# Patient Record
Sex: Male | Born: 1970 | Race: White | Hispanic: Refuse to answer | Marital: Married | Smoking: Never smoker
Health system: Southern US, Community
[De-identification: ages and names within clinical notes are randomized; demographics above are authoritative.]

## PROBLEM LIST (undated history)

## (undated) DIAGNOSIS — Z0000 Encounter for general adult medical examination without abnormal findings: Secondary | ICD-10-CM

## (undated) DIAGNOSIS — G4730 Sleep apnea, unspecified: Secondary | ICD-10-CM

## (undated) DIAGNOSIS — M545 Low back pain, unspecified: Secondary | ICD-10-CM

## (undated) HISTORY — DX: Encounter for general adult medical examination without abnormal findings: Z00.00

## (undated) HISTORY — DX: Low back pain, unspecified: M54.50

## (undated) HISTORY — DX: Sleep apnea, unspecified: G47.30

## (undated) HISTORY — DX: Low back pain: M54.5

---

## 2001-03-04 ENCOUNTER — Ambulatory Visit (HOSPITAL_COMMUNITY): Admission: RE | Admit: 2001-03-04 | Discharge: 2001-03-04 | Payer: Self-pay | Admitting: Orthopedic Surgery

## 2001-03-04 ENCOUNTER — Encounter: Payer: Self-pay | Admitting: Orthopedic Surgery

## 2002-02-06 ENCOUNTER — Emergency Department (HOSPITAL_COMMUNITY): Admission: EM | Admit: 2002-02-06 | Discharge: 2002-02-06 | Payer: Self-pay | Admitting: Emergency Medicine

## 2002-02-06 ENCOUNTER — Encounter: Payer: Self-pay | Admitting: Emergency Medicine

## 2002-08-05 ENCOUNTER — Encounter: Payer: Self-pay | Admitting: General Practice

## 2002-08-05 ENCOUNTER — Encounter: Admission: RE | Admit: 2002-08-05 | Discharge: 2002-08-05 | Payer: Self-pay | Admitting: General Practice

## 2002-10-04 ENCOUNTER — Ambulatory Visit (HOSPITAL_COMMUNITY): Admission: RE | Admit: 2002-10-04 | Discharge: 2002-10-04 | Payer: Self-pay

## 2007-02-23 ENCOUNTER — Ambulatory Visit: Payer: Self-pay | Admitting: Pulmonary Disease

## 2007-02-23 LAB — CONVERTED CEMR LAB
ALT: 32 units/L (ref 0–40)
AST: 26 units/L (ref 0–37)
Albumin: 4 g/dL (ref 3.5–5.2)
Alkaline Phosphatase: 93 units/L (ref 39–117)
BUN: 11 mg/dL (ref 6–23)
Basophils Absolute: 0 10*3/uL (ref 0.0–0.1)
Basophils Relative: 0.3 % (ref 0.0–1.0)
Bilirubin Urine: NEGATIVE
Calcium: 9.2 mg/dL (ref 8.4–10.5)
Chloride: 107 meq/L (ref 96–112)
Creatinine, Ser: 1.2 mg/dL (ref 0.4–1.5)
HDL: 42.7 mg/dL (ref >39.0)
Hemoglobin, Urine: NEGATIVE
Ketones, ur: NEGATIVE mg/dL
LDL Cholesterol: 106 mg/dL — ABNORMAL HIGH (ref 0–99)
MCHC: 34.4 g/dL (ref 30.0–36.0)
Monocytes Relative: 6.7 % (ref 3.0–11.0)
Platelets: 296 10*3/uL (ref 150–400)
Potassium: 4.2 meq/L (ref 3.5–5.1)
RBC: 4.91 M/uL (ref 4.22–5.81)
RDW: 12.1 % (ref 11.5–14.6)
Specific Gravity, Urine: 1.015 (ref 1.000–1.03)
Total Bilirubin: 1.6 mg/dL — ABNORMAL HIGH (ref 0.3–1.2)
Total Protein, Urine: NEGATIVE mg/dL
Triglycerides: 62 mg/dL (ref 0–149)
VLDL: 12 mg/dL (ref 0–40)
pH: 7 (ref 5.0–8.0)

## 2007-06-08 ENCOUNTER — Ambulatory Visit: Payer: Self-pay | Admitting: Pulmonary Disease

## 2008-10-18 ENCOUNTER — Telehealth (INDEPENDENT_AMBULATORY_CARE_PROVIDER_SITE_OTHER): Payer: Self-pay | Admitting: *Deleted

## 2008-12-18 DIAGNOSIS — Z9889 Other specified postprocedural states: Secondary | ICD-10-CM | POA: Insufficient documentation

## 2008-12-19 ENCOUNTER — Ambulatory Visit: Payer: Self-pay | Admitting: Pulmonary Disease

## 2009-01-14 DIAGNOSIS — M545 Low back pain, unspecified: Secondary | ICD-10-CM | POA: Insufficient documentation

## 2009-04-27 ENCOUNTER — Telehealth (INDEPENDENT_AMBULATORY_CARE_PROVIDER_SITE_OTHER): Payer: Self-pay | Admitting: *Deleted

## 2009-06-12 ENCOUNTER — Telehealth: Payer: Self-pay | Admitting: Adult Health

## 2009-06-12 ENCOUNTER — Ambulatory Visit: Payer: Self-pay | Admitting: Internal Medicine

## 2009-06-13 LAB — CONVERTED CEMR LAB
Basophils Absolute: 0 10*3/uL (ref 0.0–0.1)
CO2: 29 meq/L (ref 19–32)
Calcium: 9.2 mg/dL (ref 8.4–10.5)
Creatinine, Ser: 1 mg/dL (ref 0.4–1.5)
Eosinophils Absolute: 0.4 10*3/uL (ref 0.0–0.7)
Glucose, Bld: 94 mg/dL (ref 70–99)
Hgb A1c MFr Bld: 5.4 % (ref 4.6–6.5)
Lymphocytes Relative: 26.7 % (ref 12.0–46.0)
MCHC: 34.5 g/dL (ref 30.0–36.0)
Neutro Abs: 4.1 10*3/uL (ref 1.4–7.7)
Neutrophils Relative %: 60.3 % (ref 43.0–77.0)
RDW: 12.3 % (ref 11.5–14.6)
Uric Acid, Serum: 5 mg/dL (ref 4.0–7.8)

## 2009-07-12 ENCOUNTER — Ambulatory Visit: Payer: Self-pay | Admitting: Adult Health

## 2009-07-12 DIAGNOSIS — M25519 Pain in unspecified shoulder: Secondary | ICD-10-CM | POA: Insufficient documentation

## 2009-10-24 ENCOUNTER — Telehealth: Payer: Self-pay | Admitting: Pulmonary Disease

## 2010-04-11 ENCOUNTER — Ambulatory Visit: Payer: Self-pay | Admitting: Pulmonary Disease

## 2010-04-22 ENCOUNTER — Ambulatory Visit: Payer: Self-pay | Admitting: Pulmonary Disease

## 2010-04-26 ENCOUNTER — Encounter: Payer: Self-pay | Admitting: Internal Medicine

## 2010-05-06 ENCOUNTER — Telehealth: Payer: Self-pay | Admitting: Pulmonary Disease

## 2010-05-17 ENCOUNTER — Ambulatory Visit: Payer: Self-pay | Admitting: Internal Medicine

## 2010-05-20 ENCOUNTER — Telehealth (INDEPENDENT_AMBULATORY_CARE_PROVIDER_SITE_OTHER): Payer: Self-pay | Admitting: *Deleted

## 2010-05-20 ENCOUNTER — Ambulatory Visit: Payer: Self-pay

## 2010-05-20 ENCOUNTER — Encounter: Payer: Self-pay | Admitting: Pulmonary Disease

## 2010-05-20 ENCOUNTER — Encounter: Payer: Self-pay | Admitting: Cardiovascular Disease

## 2010-05-22 ENCOUNTER — Encounter: Payer: Self-pay | Admitting: Internal Medicine

## 2010-05-24 ENCOUNTER — Encounter: Admission: RE | Admit: 2010-05-24 | Discharge: 2010-05-24 | Payer: Self-pay | Admitting: Internal Medicine

## 2010-05-24 ENCOUNTER — Inpatient Hospital Stay (HOSPITAL_COMMUNITY): Admission: EM | Admit: 2010-05-24 | Discharge: 2010-05-25 | Payer: Self-pay | Admitting: Emergency Medicine

## 2010-07-18 ENCOUNTER — Telehealth (INDEPENDENT_AMBULATORY_CARE_PROVIDER_SITE_OTHER): Payer: Self-pay | Admitting: *Deleted

## 2010-07-18 ENCOUNTER — Ambulatory Visit: Payer: Self-pay | Admitting: Pulmonary Disease

## 2010-07-30 ENCOUNTER — Telehealth (INDEPENDENT_AMBULATORY_CARE_PROVIDER_SITE_OTHER): Payer: Self-pay | Admitting: *Deleted

## 2010-07-31 ENCOUNTER — Ambulatory Visit: Payer: Self-pay | Admitting: Internal Medicine

## 2010-11-10 LAB — CONVERTED CEMR LAB
ALT: 24 units/L (ref 0–53)
AST: 23 units/L (ref 0–37)
Basophils Absolute: 0 10*3/uL (ref 0.0–0.1)
Basophils Relative: 0.6 % (ref 0.0–3.0)
Bilirubin, Direct: 0.2 mg/dL (ref 0.0–0.3)
CO2: 28 meq/L (ref 19–32)
Chloride: 105 meq/L (ref 96–112)
Cholesterol: 168 mg/dL (ref 0–200)
LDL Cholesterol: 108 mg/dL — ABNORMAL HIGH (ref 0–99)
Lymphocytes Relative: 29.5 % (ref 12.0–46.0)
MCHC: 35.1 g/dL (ref 30.0–36.0)
Neutrophils Relative %: 57.1 % (ref 43.0–77.0)
RBC: 4.89 M/uL (ref 4.22–5.81)
RDW: 11.9 % (ref 11.5–14.6)
Sodium: 141 meq/L (ref 135–145)
Total Bilirubin: 1.5 mg/dL — ABNORMAL HIGH (ref 0.3–1.2)
Total CHOL/HDL Ratio: 3.9
VLDL: 16 mg/dL (ref 0–40)

## 2010-11-12 NOTE — Progress Notes (Signed)
Summary: sinus infection----appt with TP today  Phone Note Call from Patient Call back at Home Phone [phone]   Caller: Patient Call For: parrett Summary of Call: Pt c/o fever, sinus infection x2 days, wants something called in.//[facility] [address]. Initial call taken by: Darletta Moll,  July 18, 2010 11:24 AM  Follow-up for Phone Call        called and spoke with pt.  pt c/o ? sinus infection, fever last night, and chest congestion but unable to cough up sputum.  Pt hasn't been seen since August 2011.  Will need ov.  Pt agreed to come in today and be seen by TP- needed latest appt possible.  Therefore pt scheduled at 4:30pm today with TP.  Aundra Millet Reynolds LPN  July 18, 2010 11:55 AM

## 2010-11-12 NOTE — Assessment & Plan Note (Signed)
Summary: Acute NP office visit - poss gout   CC:  poss gout in left foot and on the top at the joint x94month w/ some redness and swelling.  History of Present Illness: 40  y/o WM that is in usual good health w/ minimal health problems. Very active w/ exercise and sports. -wakeboarding.   July 12, 2009--Presents for an acute office visit. 1 week ago, wakeboarding, fell while doing a backflip, held on to rope when hit water felt arm/shoulder pull. Since then left shoulder upper arm, sore, painful to move, lift up. No radicular symptoms, tingling. Has not used meds. Previous episode w/ right should w/ MRI showing rotator cuff partial tear, no surgery. Took  ~1 yr to heal. Seen by [place] in 2007. Denies chest pain, dyspnea, orthopnea, hemoptysis, fever, n/v/d, edema, headache  April 11, 2010--Presents for an acute office visit. Complains of poss gout in left foot, on the top at the joint x39month w/ some redness and swelling. Severe pain initially, taking motrin w/ some help, swelling is better. No fever. He has hx of gout in past. Noticed symptoms 1 month ago after increasing protein supplements/shakes to 3 shakes a day.  Takes motrin 600mg  three times a day.  No rash, other joint swelling. Denies chest pain, dyspnea, orthopnea, hemoptysis, fever, n/v/d, edema, headache,recent travel.   Medications Prior to Update: 1)  Robaxin 500 Mg Tabs (Methocarbamol) .... Take 1 Tab By Mouth Three Times A Day As Needed For Muscle Spasm... 2)  Tramadol Hcl 50 Mg Tabs (Tramadol Hcl) .... Take 1 Tab By Mouth Three Times A Day As Needed For Pain.Marland KitchenMarland Kitchen 3)  Avelox 400 Mg Tabs (Moxifloxacin Hcl) .... Take 1 Tablet By Mouth Once A Day 4)  Medrol (Pak) 4 Mg Tabs (Methylprednisolone) .... As Directed  Current Medications (verified): 1)  Robaxin 500 Mg Tabs (Methocarbamol) .... Take 1 Tab By Mouth Three Times A Day As Needed For Muscle Spasm... 2)  Tramadol Hcl 50 Mg Tabs (Tramadol Hcl) .... Take 1 Tab By Mouth Three  Times A Day As Needed For Pain...  Allergies (verified): 1)  ! Pcn  Past History:  Past Medical History: Last updated: 07/12/2009 PHYSICAL EXAMINATION (ICD-V70.0) - good general health without new complaints or concerns...  ROTATOR CUFF partial tear in 2007, seen by Dr. Georg Ruddle PAIN, LUMBAR (ICD-724.2) - neg lumbar spine films in 2008...   Past Surgical History: Last updated: 12/19/2008 S/P rotator cuff surgery 2007  Family History: Last updated: 06/12/2009 Father, Garron Eline, alive age 51- smoker/ COPD, HBP, Chol Mother, Marcin Holte, alive age 55 - Chol, osteoporosis, colon polyps DM -PGM  Social History: Last updated: 12/19/2008 Married  1 child- daughter never smoked social alcohol  Risk Factors: Smoking Status: never (12/18/2008)  Review of Systems      See HPI  Vital Signs:  Patient profile:   40 year old male Height:      76 inches Weight:      218.31 pounds BMI:     26.67 O2 Sat:      98 % on Room air Temp:     96.8 degrees F oral Pulse rate:   71 / minute BP sitting:   120 / 70  (left arm) Cuff size:   large  Vitals Entered By: Boone Master CNA/MA (April 11, 2010 9:53 AM)  O2 Flow:  Room air CC: poss gout in left foot, on the top at the joint x30month w/ some redness and swelling Is Patient Diabetic? No  Comments Medications reviewed with patient Daytime contact number verified with patient. Boone Master CNA/MA  April 11, 2010 9:53 AM    Physical Exam  Additional Exam:  WD, WN, 40 y/o WM in NAD... GENERAL:  Alert & oriented; pleasant & cooperative. HEENT:  [place]/AT, E  NOSE-clear, THROAT-clear & wnl. NECK:  Supple w/ full ROM; no JVD; normal carotid impulses w/o bruits;   CHEST:  Clear to P & A; without wheezes/ rales/ or rhonchi. HEART:  Regular Rhythm; without murmurs/ rubs/ or gallops. ABDOMEN:  Soft & nontender; normal bowel sounds; no organomegaly or masses detected.  EXT: along base of 3/4th toe tender mild redness , nml rom.  mild puffiness at base. no varicose veins/ venous insuffic/ or edema.,  NEURO:  intact no focal deficits noted.  DERM:  No lesions noted; no rash etc...     Impression & Recommendations:  Problem # 1:  FOOT PAIN (ICD-729.5) ?etiolgoy possible gout flare.  will check labs w/ esr and uric acid.  we discussed tx options , will use steroid taper along w/ warm soaks for now.  pending labs decide on further tx .  would hold high dose protein shakes for now.   Medications Added to Medication List This Visit: 1)  Prednisone 10 Mg Tabs (Prednisone) .... 4 tabs for 2 days, then 3 tabs for 2 days, 2 tabs for 2 days, then 1 tab for 2 days, then stop  Complete Medication List: 1)  Robaxin 500 Mg Tabs (Methocarbamol) .... Take 1 tab by mouth three times a day as needed for muscle spasm... 2)  Tramadol Hcl 50 Mg Tabs (Tramadol hcl) .... Take 1 tab by mouth three times a day as needed for pain.Marland KitchenMarland Kitchen 3)  Prednisone 10 Mg Tabs (Prednisone) .... 4 tabs for 2 days, then 3 tabs for 2 days, 2 tabs for 2 days, then 1 tab for 2 days, then stop  Other Orders: TLB-Uric Acid, Blood (84550-URIC) TLB-Sedimentation Rate (ESR) (85652-ESR) Est. Patient Level IV (91478)  Patient Instructions: 1)  Rest elevate and warm soaks to foot 2)  Prednisone taper over next week.  3)  Gout prevention sheet 4)  I will call with labs.  5)  Please contact office for sooner follow up if symptoms do not improve or worsen  Prescriptions: PREDNISONE 10 MG TABS (PREDNISONE) 4 tabs for 2 days, then 3 tabs for 2 days, 2 tabs for 2 days, then 1 tab for 2 days, then stop  #20 x 0   Entered and Authorized by:   Rubye Oaks NP   Signed by:   Rubye Oaks NP on 04/11/2010   Method used:   Electronically to        [facility]* (retail)       [address] [place], [place]  [address]       Ph: [phone]       Fax: [phone]   RxID:   [id number]

## 2010-11-12 NOTE — Letter (Signed)
Summary: [facility]   Imported By: Sherian Rein 06/10/2010 09:59:07  _____________________________________________________________________  External Attachment:    Type:   Image     Comment:   External Document

## 2010-11-12 NOTE — Letter (Signed)
Summary: LT foot/[facility]  LT foot/[facility]   Imported By: Sherian Rein 05/28/2010 08:10:49  _____________________________________________________________________  External Attachment:    Type:   Image     Comment:   External Document

## 2010-11-12 NOTE — Progress Notes (Signed)
Summary: COUGH > needs ov---ov with MW for 07/31/2010  Phone Note Call from Patient   Caller: Patient Call For: PARRETT Summary of Call: PT WOULD LIKE SOMETHING FOR COUGH [facility] [address] Initial call taken by: Rickard Patience,  July 30, 2010 9:35 AM  Follow-up for Phone Call        called and spoke with pt.  pt was just recently seen by TP on 07/18/2010.  Pt states he has followed TP's recs from that visit including taking Clindamycin and Mucinex.  Pt states he hasn't noticed "much improvement in his Sx."  Pt c/o a "deep hacking cough" Pt states first thing in the morning he will cough up brown sputum but will then be non-productive throughout the rest of the day.  Pt also c/o tightness in chest and increased sob with activity.  Pt states when he 'breathes out it causes him to wheeze and it aggrevates his throat also causing him to cough."  Pt denied sore throat or fever.  Pt requests rx for cough.  Please advise.  Thanks.  Aundra Millet Reynolds LPN  July 30, 2010 9:57 AM  allergies: PCN, Ibu, Caffeine  see if we can work him in 10/20  with all meds in hand - nothing else to offer overy the phone Follow-up by: Nyoka Cowden MD,  July 30, 2010 1:26 PM  Additional Follow-up for Phone Call Additional follow up Details #1::        called and spoke with pt.  informed him of MW's recs.  Pt would like to be seen tomorrow.  Scheduled him to see MW tomorrow 07/31/2010 at 10:20am.  instructed pt to bring all meds with him to the visit.  pt verbalized understanding.  Aundra Millet Reynolds LPN  July 30, 2010 2:01 PM

## 2010-11-12 NOTE — Assessment & Plan Note (Signed)
Summary: NP follow up   CC:  foot no better and states the prednisone did help but 1.5wks ago pt "popped" the 3rd toe and the swelling and pain worsened.  also states back of left heel is painful and some edema in the left knee.  History of Present Illness: 40  y/o WM that is in usual good health w/ minimal health problems. Very active w/ exercise and sports. -wakeboarding.   July 12, 2009--Presents for an acute office visit. 1 week ago, wakeboarding, fell while doing a backflip, held on to rope when hit water felt arm/shoulder pull. Since then left shoulder upper arm, sore, painful to move, lift up. No radicular symptoms, tingling. Has not used meds. Previous episode w/ right should w/ MRI showing rotator cuff partial tear, no surgery. Took  ~1 yr to heal. Seen by [place] in 2007. Denies chest pain, dyspnea, orthopnea, hemoptysis, fever, n/v/d, edema, headache  April 11, 2010--Presents for an acute office visit. Complains of poss gout in left foot, on the top at the joint x42month w/ some redness and swelling. Severe pain initially, taking motrin w/ some help, swelling is better. No fever. He has hx of gout in past. Noticed symptoms 1 month ago after increasing protein supplements/shakes to 3 shakes a day.  Takes motrin 600mg  three times a day.  No rash, other joint swelling. Denies chest pain, dyspnea, orthopnea, hemoptysis, fever, n/v/d, edema, headache,recent travel.   April 22, 2010--Returns for persistent foot pain. Seen 2 weeks ago w/ left dorsal foot pain x 1 month. Uric acid was low. Minimal elevated ESR at . Given steroid taper. Returns w/ no improvement in symptoms.  wakeboards 1x week., exercising-weight liftings leg work 1-2 month. Area on top of foot along base of 2-4 digits is sore, swollen and tender. NO redness or fever. Denies chest pain, dyspnea, orthopnea, hemoptysis, fever, n/v/d.   Medications Prior to Update: 1)  Robaxin 500 Mg Tabs (Methocarbamol) .... Take 1 Tab By Mouth  Three Times A Day As Needed For Muscle Spasm... 2)  Tramadol Hcl 50 Mg Tabs (Tramadol Hcl) .... Take 1 Tab By Mouth Three Times A Day As Needed For Pain.Marland KitchenMarland Kitchen 3)  Prednisone 10 Mg Tabs (Prednisone) .... 4 Tabs For 2 Days, Then 3 Tabs For 2 Days, 2 Tabs For 2 Days, Then 1 Tab For 2 Days, Then Stop  Current Medications (verified): 1)  Robaxin 500 Mg Tabs (Methocarbamol) .... Take 1 Tab By Mouth Three Times A Day As Needed For Muscle Spasm... 2)  Tramadol Hcl 50 Mg Tabs (Tramadol Hcl) .... Take 1 Tab By Mouth Three Times A Day As Needed For Pain...  Allergies (verified): 1)  ! Pcn  Past History:  Past Medical History: Last updated: 07/12/2009 PHYSICAL EXAMINATION (ICD-V70.0) - good general health without new complaints or concerns...  ROTATOR CUFF partial tear in 2007, seen by Dr. Georg Ruddle PAIN, LUMBAR (ICD-724.2) - neg lumbar spine films in 2008...   Past Surgical History: Last updated: 12/19/2008 S/P rotator cuff surgery 2007  Family History: Last updated: 06/12/2009 Father, Keisuke Hollabaugh, alive age 93- smoker/ COPD, HBP, Chol Mother, Baine Decesare, alive age 80 - Chol, osteoporosis, colon polyps DM -PGM  Social History: Last updated: 12/19/2008 Married  1 child- daughter never smoked social alcohol  Risk Factors: Smoking Status: never (12/18/2008)  Review of Systems      See HPI  Vital Signs:  Patient profile:   40 year old male Height:      76 inches  Weight:      218 pounds BMI:     26.63 O2 Sat:      98 % on Room air Temp:     98.2 degrees F oral Pulse rate:   82 / minute BP sitting:   134 / 84  (left arm) Cuff size:   regular  Vitals Entered By: Boone Master CNA/MA (April 22, 2010 12:19 PM)  O2 Flow:  Room air CC: foot no better, states the prednisone did help but 1.5wks ago pt "popped" the 3rd toe and the swelling and pain worsened.  also states back of left heel is painful and some edema in the left knee Is Patient Diabetic? No Comments  Medications reviewed with patient Daytime contact number verified with patient. Boone Master CNA/MA  April 22, 2010 12:43 PM    Physical Exam  Additional Exam:  WD, WN, 40 y/o WM in NAD... GENERAL:  Alert & oriented; pleasant & cooperative. HEENT:  [place]/AT, E  NOSE-clear, THROAT-clear & wnl. NECK:  Supple w/ full ROM; no JVD; normal carotid impulses w/o bruits;   CHEST:  Clear to P & A; without wheezes/ rales/ or rhonchi. HEART:  Regular Rhythm; without murmurs/ rubs/ or gallops. ABDOMEN:  Soft & nontender; normal bowel sounds; no organomegaly or masses detected.  EXT: along base of 3/4th toe tender mild redness , nml rom. mild puffiness at base. no varicose veins/ venous insuffic/ or edema.,  NEURO:  intact no focal deficits noted.  DERM:  No lesions noted; no rash etc...     Impression & Recommendations:  Problem # 1:  FOOT PAIN (ICD-729.5) ? etiology of left foot pain /swelling  xray w/ no fx  REC:    We are setting you up with an orthopedist to evaluate foot pain.  Tramadol 50mg  as needed pain.  Rest elevate and ice to foot.   Please contact office for sooner follow up if symptoms do not improve or worsen  Orders: T-Foot Left Min 3 Views [id number]) Orthopedic Referral (Ortho) Est. Patient Level III (41324)  Complete Medication List: 1)  Robaxin 500 Mg Tabs (Methocarbamol) .... Take 1 tab by mouth three times a day as needed for muscle spasm... 2)  Tramadol Hcl 50 Mg Tabs (Tramadol hcl) .... Take 1 tab by mouth three times a day as needed for pain...  Patient Instructions: 1)  Set up for physical w/ Dr. Kriste Basque in march of next year.  2)  We are setting you up with an orthopedist to evaluate foot pain.  3)  Tramadol 50mg  as needed pain.  4)  Rest elevate and ice to foot.   5)  Please contact office for sooner follow up if symptoms do not improve or worsen

## 2010-11-12 NOTE — Miscellaneous (Signed)
Summary: Orders Update  Clinical Lists Changes  Orders: Added new Referral order of Rheumatology Referral (Rheumatology) - Signed 

## 2010-11-12 NOTE — Assessment & Plan Note (Signed)
Summary: cough/ congestion/ mbw   Primary Provider/Referring Provider:  Dr. Alroy Dust  CC:  achy all over---not feeling good--dry cough x 2 days--constant fever.  History of Present Illness: 40  y/o WM that is in usual good health w/ minimal health problems. Very active w/ exercise and sports. -wakeboarding.   July 12, 2009--Presents for an acute office visit. 1 week ago, wakeboarding, fell while doing a backflip, held on to rope when hit water felt arm/shoulder pull. Since then left shoulder upper arm, sore, painful to move, lift up. No radicular symptoms, tingling. Has not used meds. Previous episode w/ right should w/ MRI showing rotator cuff partial tear, no surgery. Took  ~1 yr to heal. Seen by [place] in 2007.  April 11, 2010--Presents for an acute office visit. Complains of poss gout in left foot, on the top at the joint x54month w/ some redness and swelling. Severe pain initially, taking motrin w/ some help, swelling is better. No fever. He has hx of gout in past. Noticed symptoms 1 month ago after increasing protein supplements/shakes to 3 shakes a day.  Takes motrin 600mg  three times a day.  No rash, other joint swelling.  April 22, 2010--Returns for persistent foot pain. Seen 2 weeks ago w/ left dorsal foot pain x 1 month. Uric acid was low. Minimal elevated ESR at . Given steroid taper. Returns w/ no improvement in symptoms.  wakeboards 1x week., exercising-weight liftings leg work 1-2 month. Area on top of foot along base of 2-4 digits is sore, swollen and tender. NO redness or fever. rec ortho eval > they rec rheum eval and rx with prednisone > all better, did not keep appt to See Dareen Piano, called in for more prednisone when symptoms flared 7/25 rx x 6 day taper  May 17, 2010 Acute visit. Pt c/o rt leg pain/swelling x 5 days- pt states that pain is worse when he stands and gets "instant swelling". no swelling below knee and the knee isn't stff, onset was actually 2 weeks ago  and noticed keys bumping area hurt, better after prednsione, worse off, with rash on Left lower leg also.  Left ankle fine now. >>refer to rheum. , venous dopp neg for dvt  July 18, 2010 --Presents for an acute office visit. Complains of 2 days of dry cough, fever tmax 101. , body aches, post nasal drainage.  Recently pt was admitted 8/12-8/13/11 for right thigh pain w/ fascitis  presumed to be inflammatory. Pt has had 3 months of lower extremities arthralgia prior to admit, tx w/ NSAIDS. Priro to admit with acute right thigh pain, sent for venous doppler which was neg for DVT. Sent to rheumatology for evaluation, MRI showed fascitis and focal myositis of the vastus lateralis muscle. w/ no descrete abscess. ESR was 19, CRP was 2.3, nml cmet, CK was 99. He was tx w/ steroids. NSAIDs were held due to possible reaction (petechiae rash along ankles). He was given empiric abx although this was felt not likely infectious. He completed a steroid pack and clindamycin.  Since discharge feels much better w/ no reoccurence of arthralgia pain. Back to baseline with activities. He is avoiding all NSAIDS. doing well until 2 days ago when he developed cough , fever and aches. Several co-workers have had similar symptoms. He has not used any meds for tx. Denies chest pain, dyspnea, orthopnea, hemoptysis,, n/v/d, edema, headache., neck stiffness, rash, joint swelling.   Preventive Screening-Counseling & Management  Alcohol-Tobacco     Smoking  Status: never  Medications Prior to Update: 1)  Robaxin 500 Mg Tabs (Methocarbamol) .... Take 1 Tab By Mouth Three Times A Day As Needed For Muscle Spasm... 2)  Tramadol Hcl 50 Mg Tabs (Tramadol Hcl) .... Take 1 Tab By Mouth Three Times A Day As Needed For Pain.Marland KitchenMarland Kitchen 3)  Prednisone 10 Mg  Tabs (Prednisone) .... 4 Each Am X 2days, 2x2days, 1x2days and Stop  Current Medications (verified): 1)  Robaxin 500 Mg Tabs (Methocarbamol) .... Take 1 Tab By Mouth Three Times A Day As Needed  For Muscle Spasm... 2)  Tramadol Hcl 50 Mg Tabs (Tramadol Hcl) .... Take 1 Tab By Mouth Three Times A Day As Needed For Pain...  Allergies: 1)  ! Pcn 2)  ! Ibuprofen 3)  ! Caffeine  Past History:  Past Surgical History: Last updated: 12/19/2008 S/P rotator cuff surgery 2007  Family History: Last updated: 06/12/2009 Father, Tysean Vandervliet, alive age 41- smoker/ COPD, HBP, Chol Mother, Wood Novacek, alive age 7 - Chol, osteoporosis, colon polyps DM -PGM  Social History: Last updated: 12/19/2008 Married  1 child- daughter never smoked social alcohol  Past Medical History: PHYSICAL EXAMINATION (ICD-V70.0) - good general health without new complaints or concerns...  ROTATOR CUFF partial tear in 2007, seen by Dr. Georg Ruddle PAIN, LUMBAR (ICD-724.2) - neg lumbar spine films in 2008  Left foot/ankle pain and R thigh pain better p prednisone per Lestine Box April 26, 2010     - Refer to rheumatology Dareen Piano) May 17, 2010       - Venous dopllers neg 05/20/10 >>Fascitis of right thigh, presumed inflammatory--MRI--- admitted 05/24/10. tx w/ steroids ?NSAID reaction  Review of Systems      See HPI  Vital Signs:  Patient profile:   40 year old male Height:      76 inches Weight:      235 pounds BMI:     28.71 O2 Sat:      98 % on Room air Temp:     101.1 degrees F oral Pulse rate:   100 / minute BP sitting:   118 / 76  (left arm) Cuff size:   large  Vitals Entered By: Randell Loop CMA (July 18, 2010 2:13 PM)  O2 Sat at Rest %:  100 O2 Flow:  Room air CC: achy all over---not feeling good--dry cough x 2 days--constant fever Is Patient Diabetic? No Pain Assessment Patient in pain? yes      Onset of pain  achy all over Comments meds and home number review with pt Randell Loop CMA  July 18, 2010 2:18 PM    Physical Exam  Additional Exam:   40 yo WM, NAD  GENERAL:  Alert & oriented; pleasant & cooperative. HEENT:  [place]/AT, E  NOSE-clear drainage,   THROAT-clear & wnl., neg nuchal rigidity NECK:  Supple w/ full ROM; no JVD; normal carotid impulses w/o bruits;   CHEST:  Clear to P & A; without wheezes/ rales/ or rhonchi. HEART:  Regular Rhythm; without murmurs/ rubs/ or gallops. ABDOMEN:  Soft & nontender; normal bowel sounds; no organomegaly or masses detected.  EXT : nml, no edema Skin: no rash, clear    Impression & Recommendations:  Problem # 1:  URI (ICD-465.9)  Acute URI vs influenze suspect is viral in nature.  recommend out of work  Plan:  Mucinex DM two times a day as needed cough/congestion Saline nasal rinses as needed  Claritin once daily as needed drainage.  Increase fluids and rest  Tylenol as needed aches and fever.  May take clindamycin as directed to have on hold if symptoms worsen /persist with discolored mucus.  Please contact office for sooner follow up if symptoms do not improve or worsen   Orders: Est. Patient Level III (16109)  Problem # 2:  LEG PAIN (ICD-729.5)  recent admission for myosiitis/facitis presumed inflammatory in nature. now improved  follow up rheumatology as scheduled and as needed   Orders: Est. Patient Level III (60454)  Complete Medication List: 1)  Robaxin 500 Mg Tabs (Methocarbamol) .... Take 1 tab by mouth three times a day as needed for muscle spasm... 2)  Tramadol Hcl 50 Mg Tabs (Tramadol hcl) .... Take 1 tab by mouth three times a day as needed for pain...  Patient Instructions: 1)  Mucinex DM two times a day as needed cough/congestion 2)  Saline nasal rinses as needed  3)  Claritin once daily as needed drainage.  4)  Increase fluids and rest  5)  Tylenol as needed aches and fever.  6)  May take clindamycin as directed to have on hold if symptoms worsen /persist with discolored mucus.  7)  Please contact office for sooner follow up if symptoms do not improve or worsen

## 2010-11-12 NOTE — Progress Notes (Signed)
Summary: fever-  Phone Note Call from Patient Call back at [phone]   Caller: Patient Call For: nadell Summary of Call: fever congestion  [facility]  Initial call taken by: Rickard Patience,  October 24, 2009 11:26 AM  Follow-up for Phone Call        called, spoke with pt.  Pt c/o chest congestion with brown and green mucus x2 1/2 wks.  states he woke up last night with a 103.0 fever, currently it's down to 100.0 after taking Goody Powder and BC.  denies chest tightness, wheezing, and SOB.  requesting med to be called in.  Will forward to SN-please advise. Thanks! Allergies-PCN [facility] Follow-up by: Gweneth Dimitri RN,  October 24, 2009 11:36 AM  Additional Follow-up for Phone Call Additional follow up Details #1::        pt called back again. Tivis Ringer  October 24, 2009 1:49 PM   per SN----ok to have avelox 400mg   #7  1 by mouth once daily  and medrol dose pak #1.  thanks Randell Loop CMA  October 24, 2009 2:26 PM     Additional Follow-up for Phone Call Additional follow up Details #2::    pt advised. rx sent. Carron Curie CMA  October 24, 2009 3:02 PM   New/Updated Medications: AVELOX 400 MG TABS (MOXIFLOXACIN HCL) Take 1 tablet by mouth once a day MEDROL (PAK) 4 MG TABS (METHYLPREDNISOLONE) as directed Prescriptions: MEDROL (PAK) 4 MG TABS (METHYLPREDNISOLONE) as directed  #1 pak x 0   Entered by:   Carron Curie CMA   Authorized by:   Michele Mcalpine MD   Signed by:   Carron Curie CMA on 10/24/2009   Method used:   Electronically to        [facility] # [phone]* (retail)       [address]       [place], [place]  [address]       Ph: [phone]       Fax: [phone]   RxID:   [id number] AVELOX 400 MG TABS (MOXIFLOXACIN HCL) Take 1 tablet by mouth once a day  #7 x 0   Entered by:   Carron Curie CMA   Authorized by:   Michele Mcalpine MD   Signed by:   Carron Curie CMA on 10/24/2009   Method used:    Electronically to        [facility] # [phone]* (retail)       [address]       [place], [place]  [address]       Ph: [phone]       Fax: [phone]   RxID:   [id number]

## 2010-11-12 NOTE — Miscellaneous (Signed)
Summary: Orders Update  Clinical Lists Changes  Orders: Added new Test order of Venous Duplex Lower Extremity (Venous Duplex Lower) - Signed 

## 2010-11-12 NOTE — Progress Notes (Signed)
Summary: swollen leg  Phone Note Call from Patient Call back at Home Phone [phone]   Caller: Patient Call For: wert Summary of Call: pt requests to speak to tp or nurse re: prednisone taper. pt says his r leg is "twice the sized of the left leg". believes this is because the dosage of prednisone is not enough. NOTE: pt saw me 8/5.  Initial call taken by: Tivis Ringer, CNA,  May 20, 2010 9:00 AM  Follow-up for Phone Call        Patient c/o increased sweeling in his right leg and was given Pred taper on Fri., 05/17/2010 by MW. The patient says his swelling is worse upon standing or walking and this Pred taper is not taken throughout the day. He will begin on 1x2 days tomorrow and would like to know what he needs to do until he can see rheumatology. Please advise.Michel Bickers CMA  May 20, 2010 9:31 AM  He missed his last rheum appt and should not be getting any more prednisone until seen - see if we can expedite the rheum w/u in any way and will need to return to see Tammy or Kriste Basque in meantime rather than be called in anything else from this office as we have no specific dx Follow-up by: Nyoka Cowden MD,  May 20, 2010 12:08 PM  Additional Follow-up for Phone Call Additional follow up Details #1::        Almyra Free or Bjorn Loser, can we see if there is any way pt can be seen sooner by rheumatology per MW.Lori Excell Seltzer [facility]  May 20, 2010 12:26 PM    Additional Follow-up for Phone Call Additional follow up Details #2::    pt has appt 05/22/10@9 :00am to see dr Vilma Meckel  med for rheu pt is aware of this appt  Follow-up by: Oneita Jolly,  May 20, 2010 12:33 PM   Appended Document: swollen leg Dr. Sherene Sires rec a stat venous doppler  Appended Document: swollen leg Dr. Sherene Sires has recommended he have a doppler of legs to evaluate persistent swelling we will call with ov appointment  Appended Document: swollen leg Spoke with pt and advised of the above.  Pt okay with this plan.  I  advised that if he had not heard from [facility] by arounf the end of the day to give [place] a call back.  Pt verbalized understanding.

## 2010-11-12 NOTE — Assessment & Plan Note (Signed)
Summary: Pulmonary/ ext ov for eval of uri/ cough   Primary Micha Dosanjh/Referring Markia Kyer:  Dr. Alroy Dust  CC:  Cough- worse .  History of Present Illness: 33  yowm never smoker  July 12, 2009--Presents for an acute office visit. 1 week ago, wakeboarding, fell while doing a backflip, held on to rope when hit water felt arm/shoulder pull. Since then left shoulder upper arm, sore, painful to move, lift up. No radicular symptoms, tingling. Has not used meds. Previous episode w/ right should w/ MRI showing rotator cuff partial tear, no surgery. Took  ~1 yr to heal. Seen by [place] in 2007.  April 11, 2010--Presents for an acute office visit. Complains of poss gout in left foot, on the top at the joint x33month w/ some redness and swelling. Severe pain initially, taking motrin w/ some help, swelling is better. No fever. He has hx of gout in past. Noticed symptoms 1 month ago after increasing protein supplements/shakes to 3 shakes a day.  Takes motrin 600mg  three times a day.  No rash, other joint swelling.  April 22, 2010--Returns for persistent foot pain. Seen 2 weeks ago w/ left dorsal foot pain x 1 month. Uric acid was low. Minimal elevated ESR at . Given steroid taper. Returns w/ no improvement in symptoms.  wakeboards 1x week., exercising-weight liftings leg work 1-2 month. Area on top of foot along base of 2-4 digits is sore, swollen and tender. NO redness or fever. rec ortho eval > they rec rheum eval and rx with prednisone > all better, did not keep appt to See Dareen Piano, called in for more prednisone when symptoms flared 7/25 rx x 6 day taper  May 17, 2010 Acute visit. Pt c/o rt leg pain/swelling x 5 days- pt states that pain is worse when he stands and gets "instant swelling". no swelling below knee and the knee isn't stff, onset was actually 2 weeks ago and noticed keys bumping area hurt, better after prednsione, worse off, with rash on Left lower leg also.  Left ankle fine now. >>refer  to rheum. , venous dopp neg for dvt July 18, 2010 --Presents for an acute office visit. Complains of 2 days of dry cough, fever tmax 101. , body aches, post nasal drainage.  Recently pt was admitted 8/12-8/13/11 for right thigh pain w/ fascitis  presumed to be inflammatory.   rec mucindex dm, clariton and clindamycin > rash on both legs one day later and stopped and rash began resolving.  July 31, 2010 cc "no better" now with congested cough, sob , mucus mostly white, subjective wheeze with FVC maneuver but no noct wheeze or am exac.  Pt denies any significant sore throat, dysphagia, itching, sneezing,  nasal congestion or excess secretions,  fever, chills, sweats, unintended wt loss, pleuritic or exertional cp, hempoptysis,orthopnea pnd or leg swelling.  overt heartburn  Current Medications (verified): 1)  Robaxin 500 Mg Tabs (Methocarbamol) .... Take 1 Tab By Mouth Three Times A Day As Needed For Muscle Spasm... 2)  Tramadol Hcl 50 Mg Tabs (Tramadol Hcl) .... Take 1 Tab By Mouth Three Times A Day As Needed For Pain.Marland KitchenMarland Kitchen 3)  Mucinex Dm 30-600 Mg Xr12h-Tab (Dextromethorphan-Guaifenesin) .Marland Kitchen.. 1 Two Times A Day As Needed 4)  Claritin 10 Mg Tabs (Loratadine) .Marland Kitchen.. 1 Once Daily As Needed  Allergies (verified): 1)  ! Pcn 2)  ! Ibuprofen 3)  ! Caffeine  Past History:  Past Medical History: PHYSICAL EXAMINATION (ICD-V70.0) - good general health without new complaints  or concerns...  ROTATOR CUFF partial tear in 2007, seen by Dr. Georg Ruddle PAIN, LUMBAR (ICD-724.2) - neg lumbar spine films in 2008  Left foot/ankle pain and R thigh pain better p prednisone per Lestine Box April 26, 2010     - Refer to rheumatology Dareen Piano) May 17, 2010  >>  last rx pred 05/27/10      - Venous dopllers neg 05/20/10 >>Fascitis of right thigh, presumed inflammatory--MRI--- admitted 05/24/10. tx w/ steroids  ?NSAID reaction  Vital Signs:  Patient profile:   40 year old male Weight:      229.38 pounds O2 Sat:       95 % on Room air Temp:     98.1 degrees F oral Pulse rate:   78 / minute BP sitting:   110 / 78  (left arm) Cuff size:   large  Vitals Entered By: Vernie Murders (July 31, 2010 10:28 AM)  O2 Flow:  Room air  Physical Exam  Additional Exam:  anxious amb wm nad wt  235 > 229 July 31, 2010  GENERAL:  Alert & oriented; pleasant & cooperative. HEENT:  [place]/AT, E  NOSE-clear drainage,  THROAT-clear & wnl., neg nuchal rigidity NECK:  Supple w/ full ROM; no JVD; normal carotid impulses w/o bruits;   CHEST:  exp bilateral rhonchi resolved with puse lip HEART:  Regular Rhythm; without murmurs/ rubs/ or gallops. ABDOMEN:  Soft & nontender; normal bowel sounds; no organomegaly or masses detected.  EXT : nml, no edema Skin: bilateral stocking pattern punctate erythematous macular rash   Impression & Recommendations:  Problem # 1:  URI (ICD-465.9)  His updated medication list for this problem includes:    Mucinex Dm 30-600 Mg Xr12h-tab (Dextromethorphan-guaifenesin) ..... Up to 1200 mg  two times a day as needed    Claritin 10 Mg Tabs (Loratadine) .Marland Kitchen... 1 once daily as needed  Explained natural h/o uri and why it's necessary in patients at risk to rx short term with PPI to reduce risk of evolving cyclical cough triggered by epithelial injury and a heightened sensitivty to the effects of any upper airway irritants,  most importantly acid - related   Hopefully will not need inhalers See instructions for specific recommendations   Problem # 2:  ARTHRALGIA UNSPECIFIED SITE (ICD-719.40)  Very unusual patter of aches / pain / rash may have underlying vasculitis > f/u rheum as needed  Each maintenance medication was reviewed in detail including most importantly the difference between maintenance prns and under what circumstances the prns are to be used.  In addition, these two groups (for which the patient should keep up with refills) were distinguished from a third group :  meds that are  used only short term with the intent to complete a course of therapy and then not refill them.  The med list was then fully reconciled and reorganized to reflect this important distinction.  See instructions for specific recommendations   Medications Added to Medication List This Visit: 1)  Mucinex Dm 30-600 Mg Xr12h-tab (Dextromethorphan-guaifenesin) .Marland Kitchen.. 1 two times a day as needed 2)  Mucinex Dm 30-600 Mg Xr12h-tab (Dextromethorphan-guaifenesin) .... Up to 1200 mg  two times a day as needed 3)  Claritin 10 Mg Tabs (Loratadine) .Marland Kitchen.. 1 once daily as needed 4)  Prednisone 10 Mg Tabs (Prednisone) .... 4 each am x 2days, 2x2days, 1x2days and stop 5)  Pepcid 20 Mg Tabs (Famotidine) .... One twice daily until not coughing at all  Other Orders: Est.  Patient Level IV (78295)  Patient Instructions: 1)  Prednisone 10 mg 4 each am x 2days, 2x2days, 1x2days and stop  2)  Pepcid otc 20 mg one after  breakfast and one at bedtime for two weeks 3)  if coughing use mucinex dm 1200 mg twice daily and if still coughing supplement with tramadol up to 100 mg every 4 hours 4)  GERD (REFLUX)  is a common cause of respiratory symptoms. It commonly presents without heartburn and can be treated with medication, but also with lifestyle changes including avoidance of late meals, excessive alcohol, smoking cessation, and avoid fatty foods, chocolate, peppermint, colas, red wine, and acidic juices such as orange juice. NO MINT OR MENTHOL PRODUCTS SO NO COUGH DROPS  5)  USE SUGARLESS CANDY INSTEAD (jolley ranchers)  6)  NO OIL BASED VITAMINS  7)  When you come to the Doctor's office always bring every active medication with you, even stuff you take over the counter  8)  if not improving we need to see you again in a week Prescriptions: PREDNISONE 10 MG  TABS (PREDNISONE) 4 each am x 2days, 2x2days, 1x2days and stop  #14 x 0   Entered and Authorized by:   Nyoka Cowden MD   Signed by:   Nyoka Cowden MD on 07/31/2010    Method used:   Electronically to        [facility] [address] [phone]* (retail)       [address]       [place], [place]  [address]       Ph: [phone] or [phone]       Fax: [phone]   RxID:   [id number]

## 2010-11-12 NOTE — Progress Notes (Signed)
Summary: rx pred pulse dose  Phone Note Call from Patient Call back at [phone]   Caller: Patient Call For: Tressie Ragin [place] to pharmacy for swelling in knee and foot pharmacy [facility] [phone] Initial call taken by: Rickard Patience,  May 06, 2010 9:13 AM  Follow-up for Phone Call        called and spoke with pt- he stated that he saw TP for his foot swelling--was sent to ortho and ortho sent him to Rhematology, which he has an appt next week with them--pt is requesting a 7 day taper of prednisone---he stated that this has helped him alot in the past---pt is afraid to take the ibuprofen as this causes him to break out in a rash.  [place].  please advise. SN and TP out of the office today. Randell Loop CMA  May 06, 2010 9:27 AM  ok, give same rx Tammy did Follow-up by: Nyoka Cowden MD,  May 06, 2010 10:14 AM  Additional Follow-up for Phone Call Additional follow up Details #1::        called in rx for pred taper to pharmacy for pt---ok per MW.  called and spoke with pt and he is aware of rx called to the pharmacy.   Randell Loop CMA  May 06, 2010 10:31 AM

## 2010-11-12 NOTE — Assessment & Plan Note (Signed)
Summary: Primary svc/ flare of leg symptoms off prednisone    Primary Provider/Referring Provider:  Dr. Alroy Dust  CC:  Acute visit. Pt c/o rt leg pain/swelling x 5 days- pt states that pain is worse when he stands and gets "instant swelling".  He denies any known injury to his leg. Marland Kitchen  History of Present Illness: 40  y/o WM that is in usual good health w/ minimal health problems. Very active w/ exercise and sports. -wakeboarding.   July 12, 2009--Presents for an acute office visit. 1 week ago, wakeboarding, fell while doing a backflip, held on to rope when hit water felt arm/shoulder pull. Since then left shoulder upper arm, sore, painful to move, lift up. No radicular symptoms, tingling. Has not used meds. Previous episode w/ right should w/ MRI showing rotator cuff partial tear, no surgery. Took  ~1 yr to heal. Seen by [place] in 2007.  April 11, 2010--Presents for an acute office visit. Complains of poss gout in left foot, on the top at the joint x62month w/ some redness and swelling. Severe pain initially, taking motrin w/ some help, swelling is better. No fever. He has hx of gout in past. Noticed symptoms 1 month ago after increasing protein supplements/shakes to 3 shakes a day.  Takes motrin 600mg  three times a day.  No rash, other joint swelling.  April 22, 2010--Returns for persistent foot pain. Seen 2 weeks ago w/ left dorsal foot pain x 1 month. Uric acid was low. Minimal elevated ESR at . Given steroid taper. Returns w/ no improvement in symptoms.  wakeboards 1x week., exercising-weight liftings leg work 1-2 month. Area on top of foot along base of 2-4 digits is sore, swollen and tender. NO redness or fever. rec ortho eval > they rec rheum eval and rx with prednisone > all better, did not keep appt to See Dareen Piano, called in for more prednisone when symptoms flared 7/25 rx x 6 day taper  May 17, 2010 Acute visit. Pt c/o rt leg pain/swelling x 5 days- pt states that pain is worse  when he stands and gets "instant swelling". no swelling below knee and the knee isn't stff, onset was actually 2 weeks ago and noticed keys bumping area hurt, better after prednsione, worse off, with rash on Left lower leg also.  Left ankle fine now.  felt feverish after bath, no chills, no tick bites or unusual exposures.  Current Medications (verified): 1)  Robaxin 500 Mg Tabs (Methocarbamol) .... Take 1 Tab By Mouth Three Times A Day As Needed For Muscle Spasm... 2)  Tramadol Hcl 50 Mg Tabs (Tramadol Hcl) .... Take 1 Tab By Mouth Three Times A Day As Needed For Pain...  Allergies (verified): 1)  ! Pcn  Past History:  Past Medical History: PHYSICAL EXAMINATION (ICD-V70.0) - good general health without new complaints or concerns...  ROTATOR CUFF partial tear in 2007, seen by Dr. Georg Ruddle PAIN, LUMBAR (ICD-724.2) - neg lumbar spine films in 2008  Left foot/ankle pain and R thigh pain better p prednisone per Lestine Box April 26, 2010     - Refer to rheumatology Dareen Piano) May 17, 2010       - Venous dopllers neg 05/20/10  Vital Signs:  Patient profile:   40 year old male Weight:      219 pounds O2 Sat:      96 % on Room air Temp:     98.7 degrees F oral Pulse rate:   83 / minute  BP sitting:   140 / 88  (left arm)  Vitals Entered By: Vernie Murders (May 17, 2010 9:32 AM)  O2 Flow:  Room air  Physical Exam  Additional Exam:  Very anxious wm failed to answer a single question asked in a straightforward manner, tending to go off on tangents or answer questions with ambiguous medical terms or diagnoses and seemed perplexed and even more anxious when asked the same question more than once for clarification.  GENERAL:  Alert & oriented; pleasant & cooperative. HEENT:  [place]/AT, E  NOSE-clear, THROAT-clear & wnl. NECK:  Supple w/ full ROM; no JVD; normal carotid impulses w/o bruits;   CHEST:  Clear to P & A; without wheezes/ rales/ or rhonchi. HEART:  Regular Rhythm; without  murmurs/ rubs/ or gallops. ABDOMEN:  Soft & nontender; normal bowel sounds; no organomegaly or masses detected.  EXT:  superficial sts right thigh above level where he had placed tight ace wrap, slt erythema, slt woody induration about palm size - right knee and left foot nl, no calf tenderness, neg homans.  also punctate erythemaous rash L calf    Impression & Recommendations:  Problem # 1:  LEG PAIN (ICD-729.5)  Very unusual findings with response to prednisone pointing to an inflammatory process but no evidence at all of infection or clotting disorder.  Def needs to see rheum before next course of prednisone compllete.   Orders: Est. Patient Level IV (74259)  Medications Added to Medication List This Visit: 1)  Prednisone 10 Mg Tabs (Prednisone) .... 4 each am x 2days, 2x2days, 1x2days and stop  Patient Instructions: 1)  Prednisone 10 mg 4 each am x 2days, 2x2days, 1x2days and stop 2)  stop heat, stop ace wraps 3)  See Patient Care Coordinator before leaving for re-rheumatology asap and also sign for release of ortho records/ labs Prescriptions: PREDNISONE 10 MG  TABS (PREDNISONE) 4 each am x 2days, 2x2days, 1x2days and stop  #14 x 0   Entered and Authorized by:   Nyoka Cowden MD   Signed by:   Nyoka Cowden MD on 05/17/2010   Method used:   Electronically to        [facility]* (retail)       [address] [place], [place]  [address]       Ph: [phone]       Fax: [phone]   RxID:   [id number]

## 2010-12-27 LAB — CULTURE, BLOOD (ROUTINE X 2): Culture: NO GROWTH

## 2010-12-27 LAB — BASIC METABOLIC PANEL
CO2: 27 mEq/L (ref 19–32)
Calcium: 9 mg/dL (ref 8.4–10.5)
Glucose, Bld: 111 mg/dL — ABNORMAL HIGH (ref 70–99)
Potassium: 3.9 mEq/L (ref 3.5–5.1)
Sodium: 140 mEq/L (ref 135–145)

## 2010-12-27 LAB — COMPREHENSIVE METABOLIC PANEL
AST: 20 U/L (ref 0–37)
Albumin: 3.7 g/dL (ref 3.5–5.2)
CO2: 29 mEq/L (ref 19–32)
Calcium: 8.9 mg/dL (ref 8.4–10.5)
Creatinine, Ser: 1.15 mg/dL (ref 0.4–1.5)
GFR calc Af Amer: >60 mL/min (ref >60)
GFR calc non Af Amer: >60 mL/min (ref >60)
Total Protein: 6.7 g/dL (ref 6.0–8.3)

## 2010-12-27 LAB — SEDIMENTATION RATE: Sed Rate: 19 mm/hr — ABNORMAL HIGH (ref 0–16)

## 2010-12-27 LAB — CBC
HCT: 40.7 % (ref 39.0–52.0)
HCT: 41.7 % (ref 39.0–52.0)
Hemoglobin: 14.1 g/dL (ref 13.0–17.0)
MCH: 31.1 pg (ref 26.0–34.0)
MCH: 31.5 pg (ref 26.0–34.0)
MCHC: 34.6 g/dL (ref 30.0–36.0)
MCV: 90.7 fL (ref 78.0–100.0)
MCV: 90.8 fL (ref 78.0–100.0)
Platelets: 226 10*3/uL (ref 150–400)
RBC: 4.6 MIL/uL (ref 4.22–5.81)

## 2010-12-27 LAB — DIFFERENTIAL
Eosinophils Relative: 4 % (ref 0–5)
Lymphocytes Relative: 29 % (ref 12–46)
Lymphs Abs: 2.1 10*3/uL (ref 0.7–4.0)

## 2011-06-05 ENCOUNTER — Other Ambulatory Visit (INDEPENDENT_AMBULATORY_CARE_PROVIDER_SITE_OTHER): Payer: Managed Care, Other (non HMO)

## 2011-06-05 ENCOUNTER — Ambulatory Visit (INDEPENDENT_AMBULATORY_CARE_PROVIDER_SITE_OTHER): Payer: Managed Care, Other (non HMO) | Admitting: Pulmonary Disease

## 2011-06-05 ENCOUNTER — Ambulatory Visit (INDEPENDENT_AMBULATORY_CARE_PROVIDER_SITE_OTHER)
Admission: RE | Admit: 2011-06-05 | Discharge: 2011-06-05 | Disposition: A | Discharge status: Home / Self Care | Payer: Managed Care, Other (non HMO) | Source: Ambulatory Visit | Attending: Pulmonary Disease | Admitting: Pulmonary Disease

## 2011-06-05 ENCOUNTER — Encounter: Payer: Self-pay | Admitting: Pulmonary Disease

## 2011-06-05 VITALS — BP 120/78 | HR 63 | Temp 97.0°F | Ht 76.0 in | Wt 240.2 lb

## 2011-06-05 DIAGNOSIS — Z0000 Encounter for general adult medical examination without abnormal findings: Secondary | ICD-10-CM

## 2011-06-05 LAB — BASIC METABOLIC PANEL
Chloride: 107 mEq/L (ref 96–112)
Potassium: 4.2 mEq/L (ref 3.5–5.1)

## 2011-06-05 LAB — URINALYSIS
Bilirubin Urine: NEGATIVE
Ketones, ur: NEGATIVE
Nitrite: NEGATIVE
Total Protein, Urine: NEGATIVE

## 2011-06-05 LAB — CBC WITH DIFFERENTIAL/PLATELET
Basophils Relative: 0.4 % (ref 0.0–3.0)
Eosinophils Absolute: 0.2 10*3/uL (ref 0.0–0.7)
HCT: 46.4 % (ref 39.0–52.0)
Lymphs Abs: 1.6 10*3/uL (ref 0.7–4.0)
MCHC: 34.2 g/dL (ref 30.0–36.0)
MCV: 92.4 fl (ref 78.0–100.0)
Monocytes Absolute: 0.5 10*3/uL (ref 0.1–1.0)
Neutrophils Relative %: 58.5 % (ref 43.0–77.0)
Platelets: 240 10*3/uL (ref 150.0–400.0)
RBC: 5.02 Mil/uL (ref 4.22–5.81)

## 2011-06-05 LAB — LIPID PANEL
LDL Cholesterol: 107 mg/dL — ABNORMAL HIGH (ref 0–99)
Total CHOL/HDL Ratio: 3

## 2011-06-05 LAB — HEPATIC FUNCTION PANEL
ALT: 20 U/L (ref 0–53)
AST: 20 U/L (ref 0–37)
Bilirubin, Direct: 0.2 mg/dL (ref 0.0–0.3)
Total Bilirubin: 1.6 mg/dL — ABNORMAL HIGH (ref 0.3–1.2)

## 2011-06-05 NOTE — Patient Instructions (Signed)
Today we did your follow up CXR & fasting blood work...    Please call the [facility]REE in a few days for your results...    Dial [id number] & when prompted enter your patient number followed by the # symbol...    Your patient number is:  [id number]  Call for any questions...  Keep up the good work w/ diet & exercise, try to keep your weight down.Marland KitchenMarland Kitchen

## 2011-06-05 NOTE — Progress Notes (Signed)
Subjective:    Patient ID: Thomas Koch, male    DOB: 1971/04/01, 40 y.o.   MRN: [id number]  HPI 40 y/o WM here for a follow up visit and CPX... he is the son of Thomas & Thomas Koch, he is essentially a non-smoker, and enjoys excellent general medical health... his only complaint is LBP intermittently- likely mechanical/ muscle related...  ~  June 05, 2011:  2.5 yr ROV & CPX> he reports feeling well & has no new complaints or concerns; on no regular meds now but relates an interesting hx over the last year w/ unknown/ undiagnosed rheumatologic syndrome; presented 8/11 w/ right leg swelling and pain in his leg, knee, foot, & soft tissues; eval by Rheum Thomas Koch ?etiology ?inflamm, ?infectious; Hosp by Thomas Koch w/ right thigh pain> ?fasciitis, polyarthritis, raynauds; treated w/ Clinda, Pred, Benedryl;  Etiology remains obscure but the pt wonders about coffee & meds he was taking "I was very sensitive to caffeine" & all symptoms finally resolved off all meds & off all caffeine...  Now he works out regularly in gym, runs, etc> feeling well w/o residual effects...    CPX today w/ norm vital signs, neg exam, & normal CXR/ EKG/ Labs...   Current Problems:   PHYSICAL EXAMINATION (ICD-V70.0) - good general health without new complaints or concerns...  Hx of right thigh pain/ swelling/ inflamm ?etiology 8/11> ?fasciitis treated w/ Antibiotics, Pred, Benedryl, etc but pt feeling this may have been caused by caffeine & finally resolved w/o residual effects off all meds & off all caffeine...  ROTATOR CUFF REPAIR, HX OF (ICD-V45.89)  Hx of BACK PAIN, LUMBAR (ICD-724.2) - neg lumbar spine films in 2008...   Past Medical History  Diagnosis Date  . Physical exam, annual   . Lumbar back pain     Past Surgical History  Procedure Date  . Rotator cuff surgery 2007    Dr. Simonne Koch    Outpatient Encounter Prescriptions as of 06/05/2011   >>   OFF ALL MEDS NOW  Medication Sig Dispense Refill  .  dextromethorphan-guaiFENesin (MUCINEX DM) 30-600 MG per 12 hr tablet Take 2 tablets by mouth every 12 (twelve) hours.        . famotidine (PEPCID) 20 MG tablet Take 20 mg by mouth 2 (two) times daily.        Marland Kitchen loratadine (CLARITIN) 10 MG tablet Take 10 mg by mouth daily.        . methocarbamol (ROBAXIN) 500 MG tablet Take 500 mg by mouth 3 (three) times daily.        . traMADol (ULTRAM) 50 MG tablet Take 50 mg by mouth every 6 (six) hours as needed.          Allergies  Allergen Reactions  . Caffeine     REACTION: causes hives, itching  . Ibuprofen     REACTION: hives  . Penicillins     REACTION: rash    Current Medications, Allergies, Past Medical History, Past Surgical History, Family History, and Social History were reviewed in [employer] record.   Review of Systems    The patient denies fever, chills, sweats, anorexia, fatigue, weakness, malaise, weight loss, sleep disorder, blurring, diplopia, eye irritation, eye discharge, vision loss, eye pain, photophobia, earache, ear discharge, tinnitus, decreased hearing, nasal congestion, nosebleeds, sore throat, hoarseness, chest pain, palpitations, syncope, dyspnea on exertion, orthopnea, PND, peripheral edema, cough, dyspnea at rest, excessive sputum, hemoptysis, wheezing, pleurisy, nausea, vomiting, diarrhea, constipation, change in bowel habits, abdominal pain, melena,  hematochezia, jaundice, gas/bloating, indigestion/heartburn, dysphagia, odynophagia, dysuria, hematuria, urinary frequency, urinary hesitancy, nocturia, incontinence, back pain, joint pain, joint swelling, muscle cramps, muscle weakness, stiffness, arthritis, sciatica, restless legs, leg pain at night, leg pain with exertion, rash, itching, dryness, suspicious lesions, paralysis, paresthesias, seizures, tremors, vertigo, transient blindness, frequent falls, frequent headaches, difficulty walking, depression, anxiety, memory loss, confusion, cold  intolerance, heat intolerance, polydipsia, polyphagia, polyuria, unusual weight change, abnormal bruising, bleeding, enlarged lymph nodes, urticaria, allergic rash, hay fever, and recurrent infections.    Objective:   Physical Exam     WD, WN, 40 y/o WM in NAD... GENERAL:  Alert & oriented; pleasant & cooperative. HEENT:  [place]/AT, EOM-wnl, PERRLA, Fundi-benign, EACs-clear, TMs-wnl, NOSE-clear, THROAT-clear & wnl. NECK:  Supple w/ full ROM; no JVD; normal carotid impulses w/o bruits; no thyromegaly or nodules palpated; no lymphadenopathy. CHEST:  Clear to P & A; without wheezes/ rales/ or rhonchi. HEART:  Regular Rhythm; without murmurs/ rubs/ or gallops. ABDOMEN:  Soft & nontender; normal bowel sounds; no organomegaly or masses detected. RECTAL:  Neg - prostate 2+ & nontender w/o nodules; stool hematest neg. EXT: without deformities or arthritic changes; no varicose veins/ venous insuffic/ or edema. NEURO:  CN's intact; motor testing normal; sensory testing normal; gait normal & balance OK. DERM:  No lesions noted; no rash etc...  CXR:  Clear & WNL.Marland KitchenMarland Kitchen  EKG:  NSR, WNL.Marland KitchenMarland Kitchen  Fasting LABS:  FLP, CBC, Chems, TSH, UA> all wnl...   Assessment & Plan:   CPX>  Good general health...  Hx right thigh pain/ swelling 8/11>  ?etiology but all resolved now & pt thinks related to caffeine sensitivity, now off all caffeine & doing satis w/o residual effects...  Hx rotator cuff repair.Marland KitchenMarland Kitchen

## 2011-06-10 ENCOUNTER — Encounter: Payer: Self-pay | Admitting: Pulmonary Disease

## 2012-08-13 HISTORY — PX: OTHER SURGICAL HISTORY: SHX169

## 2012-11-20 ENCOUNTER — Other Ambulatory Visit: Payer: Self-pay | Admitting: Pulmonary Disease

## 2013-02-14 ENCOUNTER — Telehealth: Payer: Self-pay | Admitting: Pulmonary Disease

## 2013-02-14 DIAGNOSIS — Z0000 Encounter for general adult medical examination without abnormal findings: Secondary | ICD-10-CM

## 2013-02-14 NOTE — Telephone Encounter (Signed)
I spoke with pt and he just wanted to make he has a CXR the day of his CPX on 03/02/13 since it has been a while since he has done one. He is not requesting to have labs done prior to his appt. This is FYI for Dr. Kriste Basque thanks

## 2013-02-15 NOTE — Telephone Encounter (Signed)
Spoke with patient made him aware of this. Verbalized understanding and nothing further needed at this time.

## 2013-02-15 NOTE — Telephone Encounter (Signed)
lmomtcb x1 for pt 

## 2013-02-15 NOTE — Telephone Encounter (Signed)
Labs have been placed in the computer for the pt and he can have the cxr done the day of his appt.  thanks

## 2013-03-02 ENCOUNTER — Ambulatory Visit (INDEPENDENT_AMBULATORY_CARE_PROVIDER_SITE_OTHER): Payer: Managed Care, Other (non HMO) | Admitting: Pulmonary Disease

## 2013-03-02 ENCOUNTER — Encounter: Payer: Self-pay | Admitting: Pulmonary Disease

## 2013-03-02 ENCOUNTER — Other Ambulatory Visit (INDEPENDENT_AMBULATORY_CARE_PROVIDER_SITE_OTHER): Payer: Managed Care, Other (non HMO)

## 2013-03-02 VITALS — BP 110/84 | HR 70 | Temp 97.2°F | Ht 76.0 in | Wt 241.0 lb

## 2013-03-02 DIAGNOSIS — E663 Overweight: Secondary | ICD-10-CM

## 2013-03-02 DIAGNOSIS — Z0000 Encounter for general adult medical examination without abnormal findings: Secondary | ICD-10-CM

## 2013-03-02 DIAGNOSIS — C439 Malignant melanoma of skin, unspecified: Secondary | ICD-10-CM

## 2013-03-02 LAB — URINALYSIS, ROUTINE W REFLEX MICROSCOPIC
Nitrite: NEGATIVE
Total Protein, Urine: NEGATIVE
Urine Glucose: NEGATIVE
pH: 6 (ref 5.0–8.0)

## 2013-03-02 LAB — CBC WITH DIFFERENTIAL/PLATELET
Basophils Relative: 0.3 % (ref 0.0–3.0)
Eosinophils Absolute: 0.2 10*3/uL (ref 0.0–0.7)
Eosinophils Relative: 3.1 % (ref 0.0–5.0)
Hemoglobin: 16 g/dL (ref 13.0–17.0)
MCHC: 34.1 g/dL (ref 30.0–36.0)
MCV: 91.6 fl (ref 78.0–100.0)
Monocytes Absolute: 0.6 10*3/uL (ref 0.1–1.0)
Neutro Abs: 4.9 10*3/uL (ref 1.4–7.7)
Neutrophils Relative %: 62.9 % (ref 43.0–77.0)
RBC: 5.11 Mil/uL (ref 4.22–5.81)
WBC: 7.7 10*3/uL (ref 4.5–10.5)

## 2013-03-02 LAB — HEPATIC FUNCTION PANEL
AST: 27 U/L (ref 0–37)
Alkaline Phosphatase: 76 U/L (ref 39–117)
Bilirubin, Direct: 0.2 mg/dL (ref 0.0–0.3)
Total Bilirubin: 1.2 mg/dL (ref 0.3–1.2)

## 2013-03-02 LAB — BASIC METABOLIC PANEL
Calcium: 8.8 mg/dL (ref 8.4–10.5)
GFR: 74.53 mL/min (ref >60.00)
Glucose, Bld: 79 mg/dL (ref 70–99)
Potassium: 3.9 mEq/L (ref 3.5–5.1)
Sodium: 140 mEq/L (ref 135–145)

## 2013-03-02 LAB — LIPID PANEL
HDL: 53.6 mg/dL (ref >39.00)
LDL Cholesterol: 76 mg/dL (ref 0–99)
Total CHOL/HDL Ratio: 3
VLDL: 9.2 mg/dL (ref 0.0–40.0)

## 2013-03-02 NOTE — Progress Notes (Signed)
Subjective:    Patient ID: Thomas Koch, male    DOB: 1971-10-01, 42 y.o.   MRN: [id number]  HPI 42 y/o WM here for a follow up visit and CPX... he is the son of Arslan & Soma Lizak, he is essentially a non-smoker, and enjoys excellent general medical health... He is a Chiropractor at [employer]...  ~  June 05, 2011:  2.5 yr ROV & CPX> he reports feeling well & has no new complaints or concerns; on no regular meds now but relates an interesting hx over the last year w/ unknown/ undiagnosed rheumatologic syndrome; presented 8/11 w/ right leg swelling and pain in his leg, knee, foot, & soft tissues; eval by Rheum DrBeekman ?etiology ?inflamm, ?infectious; Hosp by [facility] w/ right thigh pain> ?fasciitis, polyarthritis, raynauds; treated w/ Clinda, Pred, Benedryl;  Etiology remains obscure but the pt wonders about coffee & meds he was taking "I was very sensitive to caffeine" & all symptoms finally resolved off all meds & off all caffeine...  Now he works out regularly in gym, runs, etc> feeling well w/o residual effects...    CPX today w/ norm vital signs, neg exam, & normal CXR/ EKG/ Labs...  ~  Mar 02, 2013:  106mo ROV & CPX> Thomas Koch describes a good interval w/o new complaints or concerns... He denies recurrent orthopedic symptoms- no back pain, shoulder discomfort, leg pain, etc... He tells me that he had a MELANOMA on his back w/ wide excision by [name] (Bx first, then wide excision)- he reports doing well & follows up w/ Derm [facility] he says...     We reviewed prob list, meds, xrays and labs> see below for updates >>  LABS 5/14:  FLP- at goals on diet alone;  Chems- wnl;  CBC- wnl;  TSH- 0.82;  UA- wnl/clear...           Problem List:    PHYSICAL EXAMINATION (ICD-V70.0) - good general health without new complaints or concerns... ~  CXR 8/12 showed normal heart size, clear lungs, NAD... ~  EKG 8/12 showed NSR, rate60, wnl...  OVERWEIGHT >> ~  Weight 5/14 = 241#, he is 76" tall, BMI=  29-30...  GU>  Hx Vasectomy age 41, Varicocele on left evaluated by [facility] 10/13  Hx of right thigh pain/ swelling/ inflamm ?etiology 8/11> ?fasciitis treated w/ Antibiotics, Pred, Benedryl, etc but pt feeling this may have been caused by caffeine & finally resolved w/o residual effects off all meds & off all caffeine...  ROTATOR CUFF REPAIR, HX OF (ICD-V45.89)  Hx of BACK PAIN, LUMBAR (ICD-724.2) - on ROBAXIN 500mg  Tid prn & TRAMADOL 50mg  Tid prn; neg lumbar spine films in 2008...  Hx Malignant Melanoma >> ~  He reports MM removed from the left side of his back by [name]> 1st w/ Bx, then w/ wide excision, & he continues regular f/u w/ derm (we do not have any notes from them)...   Past Medical History  Diagnosis Date  . Physical exam, annual   . Lumbar back pain     Past Surgical History  Procedure Laterality Date  . Rotator cuff surgery  2007    Dr. Simonne Come  . Melanona removed  08/2012    on his upper back    Outpatient Encounter Prescriptions as of 03/02/2013  Medication Sig Dispense Refill  . methocarbamol (ROBAXIN) 500 MG tablet Take 500 mg by mouth 3 (three) times daily as needed.       . traMADol (ULTRAM) 50 MG  tablet Take 50 mg by mouth every 6 (six) hours as needed.        . [DISCONTINUED] dextromethorphan-guaiFENesin (MUCINEX DM) 30-600 MG per 12 hr tablet Take 2 tablets by mouth every 12 (twelve) hours.        . [DISCONTINUED] famotidine (PEPCID) 20 MG tablet Take 20 mg by mouth 2 (two) times daily.        . [DISCONTINUED] loratadine (CLARITIN) 10 MG tablet Take 10 mg by mouth daily.        . [DISCONTINUED] VIAGRA 100 MG tablet take as directed  10 tablet  PRN   No facility-administered encounter medications on file as of 03/02/2013.    Allergies  Allergen Reactions  . Caffeine     REACTION: causes hives, itching  . Ibuprofen     REACTION: hives  . Penicillins     REACTION: rash    Current Medications, Allergies, Past Medical  History, Past Surgical History, Family History, and Social History were reviewed in [employer] record.   Review of Systems    The patient denies fever, chills, sweats, anorexia, fatigue, weakness, malaise, weight loss, sleep disorder, blurring, diplopia, eye irritation, eye discharge, vision loss, eye pain, photophobia, earache, ear discharge, tinnitus, decreased hearing, nasal congestion, nosebleeds, sore throat, hoarseness, chest pain, palpitations, syncope, dyspnea on exertion, orthopnea, PND, peripheral edema, cough, dyspnea at rest, excessive sputum, hemoptysis, wheezing, pleurisy, nausea, vomiting, diarrhea, constipation, change in bowel habits, abdominal pain, melena, hematochezia, jaundice, gas/bloating, indigestion/heartburn, dysphagia, odynophagia, dysuria, hematuria, urinary frequency, urinary hesitancy, nocturia, incontinence, back pain, joint pain, joint swelling, muscle cramps, muscle weakness, stiffness, arthritis, sciatica, restless legs, leg pain at night, leg pain with exertion, rash, itching, dryness, suspicious lesions, paralysis, paresthesias, seizures, tremors, vertigo, transient blindness, frequent falls, frequent headaches, difficulty walking, depression, anxiety, memory loss, confusion, cold intolerance, heat intolerance, polydipsia, polyphagia, polyuria, unusual weight change, abnormal bruising, bleeding, enlarged lymph nodes, urticaria, allergic rash, hay fever, and recurrent infections.    Objective:   Physical Exam     WD, WN, 42 y/o WM in NAD... GENERAL:  Alert & oriented; pleasant & cooperative. HEENT:  [place]/AT, EOM-wnl, PERRLA, Fundi-benign, EACs-clear, TMs-wnl, NOSE-clear, THROAT-clear & wnl. NECK:  Supple w/ full ROM; no JVD; normal carotid impulses w/o bruits; no thyromegaly or nodules palpated; no lymphadenopathy. CHEST:  Clear to P & A; without wheezes/ rales/ or rhonchi. BACK:  Scar toward left side of upper back- from melanoma removal by  [name]... HEART:  Regular Rhythm; without murmurs/ rubs/ or gallops. ABDOMEN:  Soft & nontender; normal bowel sounds; no organomegaly or masses detected. RECTAL:  Neg - prostate 2+ & nontender w/o nodules; stool hematest neg. EXT: without deformities or arthritic changes; no varicose veins/ venous insuffic/ or edema. NEURO:  CN's intact; motor testing normal; sensory testing normal; gait normal & balance OK. DERM:  No lesions noted; no rash etc...  RADIOLOGY DATA:  Reviewed in the EPIC EMR & discussed w/ the patient...  LABORATORY DATA:  Reviewed in the EPIC EMR & discussed w/ the patient...   Assessment & Plan:    CPX>  Good general health...  Overweight>  We reviewed diet, exercise, wt reduction strategies...  ORTHO>  Hx LBP, Rotator cuff tear, prev right thigh pain; he denies current issues, doing satis.....  Hx MELANOMA>  He reports that this was removed from the left side of his back in 2013 by [name] & they continue to follow (we do not have records)...   Patient's Medications  New Prescriptions  No medications on file  Previous Medications   METHOCARBAMOL (ROBAXIN) 500 MG TABLET    Take 500 mg by mouth 3 (three) times daily as needed.    TRAMADOL (ULTRAM) 50 MG TABLET    Take 50 mg by mouth every 6 (six) hours as needed.    Modified Medications   No medications on file  Discontinued Medications   DEXTROMETHORPHAN-GUAIFENESIN (MUCINEX DM) 30-600 MG PER 12 HR TABLET    Take 2 tablets by mouth every 12 (twelve) hours.     FAMOTIDINE (PEPCID) 20 MG TABLET    Take 20 mg by mouth 2 (two) times daily.     LORATADINE (CLARITIN) 10 MG TABLET    Take 10 mg by mouth daily.     VIAGRA 100 MG TABLET    take as directed

## 2013-03-02 NOTE — Patient Instructions (Addendum)
Today we updated your med list in our EPIC system...     Today we did your follow up FASTING blood work...    We will contact you w/ the results when available...   Keep up the great job w/ diet & exercise...  Call for any questions...  Let's plan a follow up visit in 1-31yrs, sooner if needed for problems.Marland KitchenMarland Kitchen

## 2013-03-03 ENCOUNTER — Telehealth: Payer: Self-pay | Admitting: Pulmonary Disease

## 2013-03-03 NOTE — Telephone Encounter (Signed)
LMTCB

## 2013-03-03 NOTE — Telephone Encounter (Signed)
Notes Recorded by Caryl Ada, CMA on 03/03/2013 at 3:18 PM [facility] x1 ------  Notes Recorded by Michele Mcalpine, MD on 03/03/2013 at 3:14 PM Please notify patient>  Labs all WNL... FLP looks great on diet alone... Chems, CBC, Thyroid, UA> all WNL.Marland KitchenMarland Kitchen  Pt aware of results. Carron Curie, CMA

## 2013-03-13 DIAGNOSIS — C439 Malignant melanoma of skin, unspecified: Secondary | ICD-10-CM | POA: Insufficient documentation

## 2013-03-13 DIAGNOSIS — E663 Overweight: Secondary | ICD-10-CM | POA: Insufficient documentation

## 2013-03-28 ENCOUNTER — Emergency Department (HOSPITAL_COMMUNITY)
Admission: EM | Admit: 2013-03-28 | Discharge: 2013-03-28 | Disposition: A | Discharge status: Home / Self Care | Payer: Managed Care, Other (non HMO) | Attending: Emergency Medicine | Admitting: Emergency Medicine

## 2013-03-28 ENCOUNTER — Encounter (HOSPITAL_COMMUNITY): Payer: Self-pay | Admitting: *Deleted

## 2013-03-28 ENCOUNTER — Emergency Department (HOSPITAL_COMMUNITY): Payer: Managed Care, Other (non HMO)

## 2013-03-28 DIAGNOSIS — Z880 Allergy status to penicillin: Secondary | ICD-10-CM | POA: Insufficient documentation

## 2013-03-28 DIAGNOSIS — Y9389 Activity, other specified: Secondary | ICD-10-CM | POA: Insufficient documentation

## 2013-03-28 DIAGNOSIS — M6740 Ganglion, unspecified site: Secondary | ICD-10-CM | POA: Insufficient documentation

## 2013-03-28 DIAGNOSIS — M67432 Ganglion, left wrist: Secondary | ICD-10-CM

## 2013-03-28 DIAGNOSIS — R296 Repeated falls: Secondary | ICD-10-CM | POA: Insufficient documentation

## 2013-03-28 DIAGNOSIS — Y9289 Other specified places as the place of occurrence of the external cause: Secondary | ICD-10-CM | POA: Insufficient documentation

## 2013-03-28 DIAGNOSIS — Z8739 Personal history of other diseases of the musculoskeletal system and connective tissue: Secondary | ICD-10-CM | POA: Insufficient documentation

## 2013-03-28 NOTE — ED Provider Notes (Signed)
History     CSN: [id number]  Arrival date & time 03/28/13  [id number]   First MD Initiated Contact with Patient 03/28/13 [phone]      No chief complaint on file.   (Consider location/radiation/quality/duration/timing/severity/associated sxs/prior treatment) Patient is a 42 y.o. male presenting with wrist pain. The history is provided by the patient. No language interpreter was used.  Wrist Pain This is a new problem. Episode onset: Pt fell backwards onto the outstretched left wrist about a week ago.  Now he notes pain in the left wrist and a lump on the dorsum o f the left wrist. The problem occurs constantly. The problem has been gradually improving. Associated symptoms comments: None . Nothing aggravates the symptoms. Nothing relieves the symptoms. He has tried nothing for the symptoms.    Past Medical History  Diagnosis Date  . Physical exam, annual   . Lumbar back pain     Past Surgical History  Procedure Laterality Date  . Melanona removed  08/2012    on his upper back    History reviewed. No pertinent family history.  History  Substance Use Topics  . Smoking status: Never Smoker   . Smokeless tobacco: Not on file  . Alcohol Use: Yes     Comment: social use      Review of Systems  Constitutional: Negative.   HENT: Negative.   Eyes: Negative.   Respiratory: Negative.   Cardiovascular: Negative.   Gastrointestinal: Negative.   Genitourinary: Negative.   Musculoskeletal:       Left wrist pain.  Skin: Negative.   Neurological: Negative.   Psychiatric/Behavioral: Negative.     Allergies  Caffeine; Ibuprofen; and Penicillins  Home Medications   Current Outpatient Rx  Name  Route  Sig  Dispense  Refill  . methocarbamol (ROBAXIN) 500 MG tablet   Oral   Take 500 mg by mouth 3 (three) times daily as needed.          . traMADol (ULTRAM) 50 MG tablet   Oral   Take 50 mg by mouth every 6 (six) hours as needed.             BP 140/86  Pulse 74  Temp(Src)  97.5 F (36.4 C) (Oral)  Resp 20  SpO2 99%  Physical Exam  Nursing note and vitals reviewed. Constitutional: He is oriented to person, place, and time. He appears well-developed and well-nourished. No distress.  Musculoskeletal:  Pt has a 7 mm ganglion cyst on the dorsum of the left wrist.  No other palpable deformity.  Intact pulses, sensation and tendon function in the left wrist.  Neurological: He is alert and oriented to person, place, and time.  No sensory or motor deficit noted.  Skin: Skin is warm and dry.  Psychiatric: He has a normal mood and affect. His behavior is normal.    ED Course  Procedures (including critical care time)  7:16 AM Pt was seen and physical exam of the left wrist performed.  X-ray of left wrist ordered.  8:09 AM Results for orders placed in visit on 03/02/13  LIPID PANEL      Result Value Range   Cholesterol 139  0 - 200 mg/dL   Triglycerides 47.8  0.0 - 149.0 mg/dL   HDL 29.56  >21.30 mg/dL   VLDL 9.2  0.0 - 86.5 mg/dL   LDL Cholesterol 76  0 - 99 mg/dL   Total CHOL/HDL Ratio 3    BASIC METABOLIC PANEL  Result Value Range   Sodium 140  135 - 145 mEq/L   Potassium 3.9  3.5 - 5.1 mEq/L   Chloride 106  96 - 112 mEq/L   CO2 26  19 - 32 mEq/L   Glucose, Bld 79  70 - 99 mg/dL   BUN 19  6 - 23 mg/dL   Creatinine, Ser 1.1  0.4 - 1.5 mg/dL   Calcium 8.8  8.4 - 45.4 mg/dL   GFR 09.81  >19.14 mL/min  HEPATIC FUNCTION PANEL      Result Value Range   Total Bilirubin 1.2  0.3 - 1.2 mg/dL   Bilirubin, Direct 0.2  0.0 - 0.3 mg/dL   Alkaline Phosphatase 76  39 - 117 U/L   AST 27  0 - 37 U/L   ALT 34  0 - 53 U/L   Total Protein 7.0  6.0 - 8.3 g/dL   Albumin 4.0  3.5 - 5.2 g/dL  CBC WITH DIFFERENTIAL      Result Value Range   WBC 7.7  4.5 - 10.5 K/uL   RBC 5.11  4.22 - 5.81 Mil/uL   Hemoglobin 16.0  13.0 - 17.0 g/dL   HCT 78.2  95.6 - 21.3 %   MCV 91.6  78.0 - 100.0 fl   MCHC 34.1  30.0 - 36.0 g/dL   RDW 08.6  57.8 - 46.9 %   Platelets  246.0  150.0 - 400.0 K/uL   Neutrophils Relative % 62.9  43.0 - 77.0 %   Lymphocytes Relative 26.6  12.0 - 46.0 %   Monocytes Relative 7.1  3.0 - 12.0 %   Eosinophils Relative 3.1  0.0 - 5.0 %   Basophils Relative 0.3  0.0 - 3.0 %   Neutro Abs 4.9  1.4 - 7.7 K/uL   Lymphs Abs 2.1  0.7 - 4.0 K/uL   Monocytes Absolute 0.6  0.1 - 1.0 K/uL   Eosinophils Absolute 0.2  0.0 - 0.7 K/uL   Basophils Absolute 0.0  0.0 - 0.1 K/uL  TSH      Result Value Range   TSH 0.82  0.35 - 5.50 uIU/mL  URINALYSIS, ROUTINE W REFLEX MICROSCOPIC      Result Value Range   Color, Urine LT. YELLOW  Yellow;Lt. Yellow   APPearance CLEAR  Clear   Specific Gravity, Urine 1.020  1.000-1.030   pH 6.0  5.0 - 8.0   Total Protein, Urine NEGATIVE  Negative   Urine Glucose NEGATIVE  Negative   Ketones, ur NEGATIVE  Negative   Bilirubin Urine NEGATIVE  Negative   Hgb urine dipstick TRACE-INTACT  Negative   Urobilinogen, UA 0.2  0.0 - 1.0   Leukocytes, UA TRACE  Negative   Nitrite NEGATIVE  Negative   WBC, UA 0-2/hpf  0-2/hpf   RBC / HPF 0-2/hpf  0-2/hpf   Squamous Epithelial / LPF Rare(0-4/hpf)  Rare(0-4/hpf)   Dg Wrist Complete Left  03/28/2013   *RADIOLOGY REPORT*  Clinical Data: Left wrist injury with pain and swelling.  LEFT WRIST - COMPLETE 3+ VIEW  Comparison:  None.  Findings:  There is no evidence of fracture or dislocation.  There is no evidence of arthropathy or other focal bone abnormality. Soft tissues are unremarkable.  IMPRESSION: Negative.   Original Report Authenticated By: Irish Lack, M.D.    X-rays of left wrist are negative.  I advised him that if his ganglion cyst bothers him he should see a hand surgeon, who could take it off.  Given the name of Dr. Janee Morn, hand surgeon on call today.    1. Ganglion cyst of wrist, left       Carleene Cooper III, MD 03/28/13 [phone]

## 2013-03-28 NOTE — ED Notes (Signed)
Pt states he fell while skating on Saturday.  Pt states there was a lot of pain on Saturday, now rates pain of a 2.  States his range is limited and he is worried his bones might be broken and healing wrong.

## 2014-07-06 ENCOUNTER — Other Ambulatory Visit: Payer: Self-pay | Admitting: Pulmonary Disease

## 2014-12-06 ENCOUNTER — Telehealth: Payer: Self-pay | Admitting: Pulmonary Disease

## 2014-12-06 MED ORDER — METHOCARBAMOL 500 MG PO TABS: 500.0000 mg | ORAL_TABLET | Freq: Three times a day (TID) | ORAL | Status: DC | PRN

## 2014-12-06 NOTE — Telephone Encounter (Signed)
Advised pt that we are still waiting to hear back from SN.  SN - please advise. Thanks.

## 2014-12-06 NOTE — Telephone Encounter (Signed)
Pt calling again to check on the status of his prescript please advise.Thomas Koch

## 2014-12-06 NOTE — Telephone Encounter (Signed)
Per SN---  Robaxin 575m   #90  1 po TID prn muscle spasm  Rest  Heat   Called and spoke with pt and he is aware of rx that has been sent to the pharmacy and nothing further is needed.

## 2014-12-06 NOTE — Telephone Encounter (Signed)
Spoke with pt, states that he is having a pulling pain between shoulder blades since this morning when doing shoulder exercises.  Is requesting a muscle relaxer.  Pt states he was given a muscle relaxer a couple years ago for a similar pain by SN.  [facility] [address].   Last ov: 03/02/13 Next ov: 12/12/14  SN please advise.  Thanks!  Allergies  Allergen Reactions  . Caffeine     REACTION: causes hives, itching  . Ibuprofen     REACTION: hives  . Penicillins     REACTION: rash   Current Outpatient Prescriptions on File Prior to Visit  Medication Sig Dispense Refill  . VIAGRA 100 MG tablet take by mouth as directed 10 tablet PRN   No current facility-administered medications on file prior to visit.

## 2014-12-07 NOTE — Telephone Encounter (Addendum)
Received call from [facility] - pt called, rx not at pharmacy Per the message, pt requested rx be [facility] to [facility] but rx was sent to [facility] Rx cancelled at [facility] (spoke with Tera) and called to [facility] (spoke with Ebony Hail)

## 2014-12-12 ENCOUNTER — Encounter: Payer: Self-pay | Admitting: Pulmonary Disease

## 2014-12-12 ENCOUNTER — Ambulatory Visit (INDEPENDENT_AMBULATORY_CARE_PROVIDER_SITE_OTHER)
Admission: RE | Admit: 2014-12-12 | Discharge: 2014-12-12 | Disposition: A | Discharge status: Home / Self Care | Payer: Managed Care, Other (non HMO) | Source: Ambulatory Visit | Attending: Pulmonary Disease | Admitting: Pulmonary Disease

## 2014-12-12 ENCOUNTER — Ambulatory Visit (INDEPENDENT_AMBULATORY_CARE_PROVIDER_SITE_OTHER): Payer: Managed Care, Other (non HMO) | Admitting: Pulmonary Disease

## 2014-12-12 VITALS — BP 128/70 | HR 77 | Temp 98.1°F | Ht 76.0 in | Wt 245.0 lb

## 2014-12-12 DIAGNOSIS — Z0000 Encounter for general adult medical examination without abnormal findings: Secondary | ICD-10-CM

## 2014-12-12 MED ORDER — PANTOPRAZOLE SODIUM 40 MG PO TBEC: 40.0000 mg | DELAYED_RELEASE_TABLET | Freq: Every day | ORAL | Status: DC

## 2014-12-12 NOTE — Patient Instructions (Signed)
Today we updated your med list in our EPIC system...    Continue your current medications the same...  We wrote a new prescription for PROTONIX (Pantoprazole) 6m tabs- take one tab daily about 30 min before the evening meal...  Consider elevating the head of you bed on 6" blocks...  Try not to eat or drink much after dinner (to allow your stomach to empty before bedtime)...  Call for any questions or if you notice any worsening swallowing problems...  Today we did your follow up CXR, EKG... Please return to our lab one morning soon for your FASTING blood work...    We will contact you w/ the results when available..Marland KitchenMarland Kitchen

## 2014-12-15 ENCOUNTER — Other Ambulatory Visit (INDEPENDENT_AMBULATORY_CARE_PROVIDER_SITE_OTHER): Payer: Managed Care, Other (non HMO)

## 2014-12-15 DIAGNOSIS — Z0000 Encounter for general adult medical examination without abnormal findings: Secondary | ICD-10-CM

## 2014-12-15 LAB — HEPATIC FUNCTION PANEL
ALT: 24 U/L (ref 0–53)
AST: 21 U/L (ref 0–37)
Albumin: 4.2 g/dL (ref 3.5–5.2)
Alkaline Phosphatase: 74 U/L (ref 39–117)
BILIRUBIN DIRECT: 0.3 mg/dL (ref 0.0–0.3)
Total Bilirubin: 1.3 mg/dL — ABNORMAL HIGH (ref 0.2–1.2)
Total Protein: 7 g/dL (ref 6.0–8.3)

## 2014-12-15 LAB — CBC WITH DIFFERENTIAL/PLATELET
Basophils Absolute: 0 10*3/uL (ref 0.0–0.1)
Basophils Relative: 0.4 % (ref 0.0–3.0)
Eosinophils Absolute: 0.2 10*3/uL (ref 0.0–0.7)
Eosinophils Relative: 3.8 % (ref 0.0–5.0)
HCT: 46.1 % (ref 39.0–52.0)
Hemoglobin: 15.7 g/dL (ref 13.0–17.0)
Lymphocytes Relative: 35 % (ref 12.0–46.0)
Lymphs Abs: 2.1 10*3/uL (ref 0.7–4.0)
MCHC: 34.1 g/dL (ref 30.0–36.0)
MCV: 90.8 fl (ref 78.0–100.0)
Monocytes Absolute: 0.5 10*3/uL (ref 0.1–1.0)
Monocytes Relative: 7.7 % (ref 3.0–12.0)
Neutro Abs: 3.2 10*3/uL (ref 1.4–7.7)
Neutrophils Relative %: 53.1 % (ref 43.0–77.0)
Platelets: 266 10*3/uL (ref 150.0–400.0)
RBC: 5.07 Mil/uL (ref 4.22–5.81)
RDW: 13.4 % (ref 11.5–15.5)
WBC: 6.1 10*3/uL (ref 4.0–10.5)

## 2014-12-15 LAB — BASIC METABOLIC PANEL
BUN: 16 mg/dL (ref 6–23)
CHLORIDE: 107 meq/L (ref 96–112)
CO2: 28 meq/L (ref 19–32)
Calcium: 9.1 mg/dL (ref 8.4–10.5)
Creatinine, Ser: 1.15 mg/dL (ref 0.40–1.50)
GFR: 73.17 mL/min (ref >60.00)
GLUCOSE: 98 mg/dL (ref 70–99)
POTASSIUM: 4.2 meq/L (ref 3.5–5.1)
Sodium: 140 mEq/L (ref 135–145)

## 2014-12-15 LAB — LIPID PANEL
Cholesterol: 155 mg/dL (ref 0–200)
HDL: 56.9 mg/dL (ref >39.00)
LDL Cholesterol: 88 mg/dL (ref 0–99)
NonHDL: 98.1
Total CHOL/HDL Ratio: 3
Triglycerides: 52 mg/dL (ref 0.0–149.0)
VLDL: 10.4 mg/dL (ref 0.0–40.0)

## 2014-12-15 LAB — TSH: TSH: 0.94 u[IU]/mL (ref 0.35–4.50)

## 2014-12-18 NOTE — Progress Notes (Addendum)
Subjective:    Patient ID: Thomas Koch, male    DOB: May 16, 1971, 44 y.o.   MRN: [id number]  HPI 44 y/o WM here for a follow up visit and CPX... he is the son of Thomas & Thomas Koch, he is essentially a non-smoker, and enjoys excellent general medical health... He is a Armed forces operational officer at [employer]...  ~  June 05, 2011:  2.5 yr ROV & CPX> he reports feeling well & has no new complaints or concerns; on no regular meds now but relates an interesting hx over the last year w/ unknown/ undiagnosed rheumatologic syndrome; presented 8/11 w/ right leg swelling and pain in his leg, knee, foot, & soft tissues; eval by Rheum DrBeekman ?etiology ?inflamm, ?infectious; Hosp by [facility] w/ right thigh pain> ?fasciitis, polyarthritis, raynauds; treated w/ Clinda, Pred, Benedryl;  Etiology remains obscure but the pt wonders about coffee & meds he was taking "I was very sensitive to caffeine" & all symptoms finally resolved off all meds & off all caffeine...  Now he works out regularly in gym, runs, etc> feeling well w/o residual effects...    CPX today w/ norm vital signs, neg exam, & normal CXR/ EKG/ Labs...  ~  Mar 02, 2013:  52moROV & CPX> Thomas Koch a good interval w/o new complaints or concerns... He denies recurrent orthopedic symptoms- no back pain, shoulder discomfort, leg pain, etc... He tells me that he had a MELANOMA on his back w/ wide excision by [name] (Bx first, then wide excision)- he reports doing well & follows up w/ Derm Q331moe says...     We reviewed prob list, meds, xrays and labs> see below for updates >>  LABS 5/14:  FLP- at goals on diet alone;  Chems- wnl;  CBC- wnl;  TSH- 0.82;  UA- wnl/clear...   ~  December 12, 2014:  2141moV & CPX> [place] a good interval- feeling well w/o new complaints or concerns; he notes occas pain in his back w/ wt lifting and asked to take it easy & discuss w/ personal trainer (he uses musc relaxer & heat for relief);  His ROS is essentially neg- notes  occas reflux symptoms & we decided to treat w/ Protonix40;  He had a melanoma removed from his back & he continues to check w/ Derm Q6mo34mo  We reviewed prob list, meds, xrays and labs> see below for updates >>   CXR 3/16 showed norm heart size, clear lungs, NAD...  Marland KitchenMarland KitchenG 3/16 showed NSR, rate72, rsr' otherw neg/ NAD...  LABS 3/16:  FLP- wnl on diet;  Chems- wnl;  CBC- wnl;  TSH=0.94...          Problem List:    OVERWEIGHT >> ~  Weight 5/14 = 241#, he is 76" tall, BMI= 29-30... ~  Weight 3/16 = 245#  Note: FLP, Liver panel, etc are all WNL...  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  ROTATOR CUFF REPAIR, HX OF (ICD-V45.89)  Hx of BACK PAIN, LUMBAR (ICD-724.2) - on ROBAXIN 500mg29m prn & TRAMADOL 50mg 71mprn; neg lumbar spine films in 2008...  Hx Malignant Melanoma >> ~  He reports MM removed from the left side of his back by  [name]> 1st w/ Bx, then w/ wide excision, & he continues regular f/u w/ derm (we do not have any notes from them)... ~  3/16: he continues to f/u w/ Derm Q33mo(we do not have notes from them)...    Past Medical History  Diagnosis Date  . Physical exam, annual   . Lumbar back pain     Past Surgical History  Procedure Laterality Date  . Melanona removed  08/2012    on his upper back    Outpatient Encounter Prescriptions as of 12/12/2014  Medication Sig  . methocarbamol (ROBAXIN) 500 MG tablet Take 1 tablet (500 mg total) by mouth 3 (three) times daily as needed for muscle spasms.  .Marland KitchenVIAGRA 100 MG tablet take by mouth as directed  . pantoprazole (PROTONIX) 40 MG tablet Take 1 tablet (40 mg total) by mouth daily.    Allergies  Allergen Reactions  . Caffeine      REACTION: causes hives, itching Pt states he can tolerate small amounts of caffeine 12/12/2014  . Ibuprofen     REACTION: hives  . Penicillins     REACTION: rash    Current Medications, Allergies, Past Medical History, Past Surgical History, Family History, and Social History were reviewed in [employer].   Review of Systems    The patient denies fever, chills, sweats, anorexia, fatigue, weakness, malaise, weight loss, sleep disorder, blurring, diplopia, eye irritation, eye discharge, vision loss, eye pain, photophobia, earache, ear discharge, tinnitus, decreased hearing, nasal congestion, nosebleeds, sore throat, hoarseness, chest pain, palpitations, syncope, dyspnea on exertion, orthopnea, PND, peripheral edema, cough, dyspnea at rest, excessive sputum, hemoptysis, wheezing, pleurisy, nausea, vomiting, diarrhea, constipation, change in bowel habits, abdominal pain, melena, hematochezia, jaundice, gas/bloating, indigestion/heartburn, dysphagia, odynophagia, dysuria, hematuria, urinary frequency, urinary hesitancy, nocturia, incontinence, back pain, joint pain, joint swelling, muscle cramps, muscle weakness, stiffness, arthritis, sciatica, restless legs, leg pain at night, leg pain with exertion, rash, itching, dryness, suspicious lesions, paralysis, paresthesias, seizures, tremors, vertigo, transient blindness, frequent falls, frequent headaches, difficulty walking, depression, anxiety, memory loss, confusion, cold intolerance, heat intolerance, polydipsia, polyphagia, polyuria, unusual weight change, abnormal bruising, bleeding, enlarged lymph nodes, urticaria, allergic rash, hay fever, and recurrent infections.    Objective:   Physical Exam     WD, WN, 44y/o WM in NAD... GENERAL:  Alert & oriented; pleasant & cooperative. HEENT:  Kennan/AT, EOM-wnl, PERRLA, Fundi-benign, EACs-clear, TMs-wnl, NOSE-clear, THROAT-clear & wnl. NECK:  Supple w/ full ROM; no JVD;  normal carotid impulses w/o bruits; no thyromegaly or nodules palpated; no lymphadenopathy. CHEST:  Clear to P & A; without wheezes/ rales/ or rhonchi. BACK:  Scar toward left side of upper back- from melanoma removal by [name]... HEART:  Regular Rhythm; without murmurs/ rubs/ or gallops. ABDOMEN:  Soft & nontender; normal bowel sounds; no organomegaly or masses detected. RECTAL:  Neg - prostate 2+ & nontender w/o nodules; stool hematest neg. EXT: without deformities or arthritic changes; no varicose veins/ venous insuffic/ or edema. NEURO:  CN's intact; motor testing normal; sensory testing normal; gait normal & balance OK. DERM:  No lesions noted; no rash etc...  RADIOLOGY DATA:  Reviewed in the EPIC EMR & discussed w/ the patient...  LABORATORY DATA:  Reviewed in the EPIC EMR & discussed w/ the patient...   Assessment & Plan:    CPX>  Good general health... He will ret for fasting labs  Overweight>  We reviewed diet, exercise, wt reduction strategies...  ORTHO>  Hx LBP, Rotator cuff  tear, prev right thigh pain; he denies current issues, doing satis.....  Hx MELANOMA>  He reports that this was removed from the left side of his back in 2013 by [name] & they continue to follow (we do not have records)...   Patient's Medications  New Prescriptions   PANTOPRAZOLE (PROTONIX) 40 MG TABLET    Take 1 tablet (40 mg total) by mouth daily.  Previous Medications   METHOCARBAMOL (ROBAXIN) 500 MG TABLET    Take 1 tablet (500 mg total) by mouth 3 (three) times daily as needed for muscle spasms.   VIAGRA 100 MG TABLET    take by mouth as directed  Modified Medications   No medications on file  Discontinued Medications   No medications on file

## 2016-01-07 ENCOUNTER — Telehealth: Payer: Self-pay | Admitting: Pulmonary Disease

## 2016-01-07 DIAGNOSIS — M79671 Pain in right foot: Secondary | ICD-10-CM

## 2016-01-07 DIAGNOSIS — M25511 Pain in right shoulder: Secondary | ICD-10-CM

## 2016-01-07 NOTE — Telephone Encounter (Signed)
Patient returned call, CB is [phone]

## 2016-01-07 NOTE — Telephone Encounter (Signed)
Spoke with pt, requesting an ortho referral for R plantar fascitis and R shoulder.  Pt has had plantar fascitis Xseveral years with no help from a podiatrist.  Pt injured his R shoulder while lifting weights a few months ago, and it has not improved over time.  SN please advise if you're ok with referral to ortho.  Thanks!

## 2016-01-07 NOTE — Telephone Encounter (Signed)
Per SN: ok to refer to [place] for R shoulder and foot pain.  lmtcb X1 for pt to make aware.  Will place referral after speaking to pt.

## 2016-01-07 NOTE — Telephone Encounter (Signed)
Spoke with pt, aware of referral.  Referral placed.  Nothing further needed.

## 2016-01-11 ENCOUNTER — Other Ambulatory Visit: Payer: Self-pay | Admitting: Emergency Medicine

## 2016-01-11 MED ORDER — SILDENAFIL CITRATE 100 MG PO TABS: ORAL_TABLET | ORAL | Status: DC

## 2016-02-13 ENCOUNTER — Telehealth: Payer: Self-pay | Admitting: Pulmonary Disease

## 2016-02-13 NOTE — Telephone Encounter (Signed)
Spoke with pt, states SN prescribed Viagra a long time ago but pt never really used medication because it did not work well.  Pt would like a rx of Cialis to try. Pt uses [facility] on [address] seen 12/2014.  No pending appt.  SN please advise on refill.  Thanks!

## 2016-02-14 MED ORDER — TADALAFIL 20 MG PO TABS: 20.0000 mg | ORAL_TABLET | Freq: Every day | ORAL | Status: DC | PRN

## 2016-02-14 NOTE — Telephone Encounter (Signed)
Per SN: Ok to dispense Cialis 68m, #10 with 5 refills, take 1 tab as directed.   Called and spoke to pt. Informed him of th medication change. Rx sent to preferred pharmacy. Pt verbalized understanding and denied any further questions or concerns at this time.

## 2016-04-01 ENCOUNTER — Other Ambulatory Visit: Payer: Self-pay | Admitting: Pulmonary Disease

## 2016-04-01 NOTE — Telephone Encounter (Signed)
LMTCB

## 2016-04-02 NOTE — Telephone Encounter (Signed)
Per SN: The equivalent of Cialis 37m is Viagra 1072m- to get the same effect.  There is no generic Viagra at 10036mut he can get generic(Sildenafil) 38m81md take the amount of tabs its takes to get the 100mg15me.  The patient can take 3-5 tabs of the Sildenafil to equal up to 100mg 27mra which is the same as taking the Cialis 38mg. 8mSildenafil comes in 100mg ta51mhen he can be prescribed this 1 tab PRN #30 x 5 refills.  Per SN, it may only take 60mg of 68mSildenafil to get the effect that he needs for his ED. Per SN, call the pharmacy to check on dosing/pricing of the Viagra/Sildenafil vs Cialis.   [facility] and [facility] the [facility] is [facility] his Rx [facility] in to.  States that for the Cialis 38mg #10 33m the patient has been getting from another pharmacy is he likely paying over $500 for #10 pills.  Pt can get Generic Viagra (Sildenafil) 38mg #50 f2m60. Sildenafil does not come in 50 or 100mg, it on68momes in 38mg.  Gave 7mal for Sildenafil 38mg tabs - T34m3-5 tabs as directed for ED, #50 x 5 refills.  These medications are going to all be cash pay as insurance does not cover these meds for the use intended. They are only covered if used for PulmHTN.   [facility] return call so that we can explain this and his new Rx.

## 2016-04-02 NOTE — Telephone Encounter (Signed)
Pt is requesting a generic form of Viagra, this is cheaper than his Cialis - needs new Rx for this. Requesting 90-day supply for Sildenafil.  Would like this sent to [facility]. Please advise Dr Lenna Gilford. Thanks.   Allergies  Allergen Reactions  . Caffeine     REACTION: causes hives, itching Pt states he can tolerate small amounts of caffeine 12/12/2014  . Ibuprofen     REACTION: hives  . Penicillins     REACTION: rash     Medication List       This list is accurate as of: 04/01/16 11:59 PM.  Always use your most recent med list.               methocarbamol 500 MG tablet  Commonly known as:  ROBAXIN  Take 1 tablet (500 mg total) by mouth 3 (three) times daily as needed for muscle spasms.     pantoprazole 40 MG tablet  Commonly known as:  PROTONIX  Take 1 tablet (40 mg total) by mouth daily.     tadalafil 20 MG tablet  Commonly known as:  CIALIS  Take 1 tablet (20 mg total) by mouth daily as needed for erectile dysfunction.

## 2016-04-02 NOTE — Telephone Encounter (Signed)
lmtcb x2 for pt. 

## 2016-04-02 NOTE — Telephone Encounter (Signed)
Pt aware of rec's per SN and is okay with taking Sildenafil 47m as directed per SN  aware that this has already been called into the pharmacy. Nothing further needed.

## 2016-04-02 NOTE — Telephone Encounter (Signed)
Pt returning call and can be reached @ [phone].Thomas Koch

## 2016-10-17 ENCOUNTER — Other Ambulatory Visit: Payer: Self-pay | Admitting: Pulmonary Disease

## 2017-03-18 DIAGNOSIS — M7732 Calcaneal spur, left foot: Secondary | ICD-10-CM | POA: Diagnosis not present

## 2017-03-18 DIAGNOSIS — M722 Plantar fascial fibromatosis: Secondary | ICD-10-CM | POA: Diagnosis not present

## 2017-04-02 DIAGNOSIS — D2262 Melanocytic nevi of left upper limb, including shoulder: Secondary | ICD-10-CM | POA: Diagnosis not present

## 2017-04-02 DIAGNOSIS — L905 Scar conditions and fibrosis of skin: Secondary | ICD-10-CM | POA: Diagnosis not present

## 2017-04-02 DIAGNOSIS — Z85828 Personal history of other malignant neoplasm of skin: Secondary | ICD-10-CM | POA: Diagnosis not present

## 2017-04-02 DIAGNOSIS — C44619 Basal cell carcinoma of skin of left upper limb, including shoulder: Secondary | ICD-10-CM | POA: Diagnosis not present

## 2017-04-02 DIAGNOSIS — L738 Other specified follicular disorders: Secondary | ICD-10-CM | POA: Diagnosis not present

## 2017-04-02 DIAGNOSIS — L700 Acne vulgaris: Secondary | ICD-10-CM | POA: Diagnosis not present

## 2017-04-08 DIAGNOSIS — C44619 Basal cell carcinoma of skin of left upper limb, including shoulder: Secondary | ICD-10-CM | POA: Diagnosis not present

## 2017-04-16 DIAGNOSIS — M722 Plantar fascial fibromatosis: Secondary | ICD-10-CM | POA: Diagnosis not present

## 2017-04-16 DIAGNOSIS — M7732 Calcaneal spur, left foot: Secondary | ICD-10-CM | POA: Diagnosis not present

## 2017-05-14 DIAGNOSIS — M7732 Calcaneal spur, left foot: Secondary | ICD-10-CM | POA: Diagnosis not present

## 2017-05-14 DIAGNOSIS — M722 Plantar fascial fibromatosis: Secondary | ICD-10-CM | POA: Diagnosis not present

## 2017-06-19 DIAGNOSIS — J342 Deviated nasal septum: Secondary | ICD-10-CM | POA: Diagnosis not present

## 2017-06-19 DIAGNOSIS — R1310 Dysphagia, unspecified: Secondary | ICD-10-CM | POA: Diagnosis not present

## 2017-06-19 DIAGNOSIS — K219 Gastro-esophageal reflux disease without esophagitis: Secondary | ICD-10-CM | POA: Diagnosis not present

## 2017-06-19 DIAGNOSIS — R0683 Snoring: Secondary | ICD-10-CM | POA: Diagnosis not present

## 2017-06-23 ENCOUNTER — Other Ambulatory Visit: Payer: Self-pay | Admitting: Pulmonary Disease

## 2017-06-23 DIAGNOSIS — L820 Inflamed seborrheic keratosis: Secondary | ICD-10-CM | POA: Diagnosis not present

## 2017-06-25 ENCOUNTER — Encounter: Payer: Self-pay | Admitting: Gastroenterology

## 2017-06-25 ENCOUNTER — Other Ambulatory Visit (INDEPENDENT_AMBULATORY_CARE_PROVIDER_SITE_OTHER): Payer: BLUE CROSS/BLUE SHIELD

## 2017-06-25 ENCOUNTER — Ambulatory Visit (INDEPENDENT_AMBULATORY_CARE_PROVIDER_SITE_OTHER): Payer: BLUE CROSS/BLUE SHIELD | Admitting: Pulmonary Disease

## 2017-06-25 VITALS — BP 126/84 | HR 66 | Temp 97.9°F | Ht 76.0 in | Wt 238.2 lb

## 2017-06-25 DIAGNOSIS — Z0000 Encounter for general adult medical examination without abnormal findings: Secondary | ICD-10-CM

## 2017-06-25 DIAGNOSIS — R1310 Dysphagia, unspecified: Secondary | ICD-10-CM

## 2017-06-25 DIAGNOSIS — R0683 Snoring: Secondary | ICD-10-CM | POA: Diagnosis not present

## 2017-06-25 LAB — LIPID PANEL
CHOLESTEROL: 165 mg/dL (ref 0–200)
HDL: 67.6 mg/dL (ref >39.00)
LDL Cholesterol: 83 mg/dL (ref 0–99)
NONHDL: 97.04
Total CHOL/HDL Ratio: 2
Triglycerides: 68 mg/dL (ref 0.0–149.0)
VLDL: 13.6 mg/dL (ref 0.0–40.0)

## 2017-06-25 LAB — COMPREHENSIVE METABOLIC PANEL
ALBUMIN: 4.3 g/dL (ref 3.5–5.2)
ALK PHOS: 81 U/L (ref 39–117)
ALT: 25 U/L (ref 0–53)
AST: 18 U/L (ref 0–37)
BILIRUBIN TOTAL: 1 mg/dL (ref 0.2–1.2)
BUN: 17 mg/dL (ref 6–23)
CO2: 27 mEq/L (ref 19–32)
CREATININE: 1.1 mg/dL (ref 0.40–1.50)
Calcium: 9.5 mg/dL (ref 8.4–10.5)
Chloride: 105 mEq/L (ref 96–112)
GFR: 76.17 mL/min (ref >60.00)
GLUCOSE: 98 mg/dL (ref 70–99)
Potassium: 4.3 mEq/L (ref 3.5–5.1)
SODIUM: 139 meq/L (ref 135–145)
TOTAL PROTEIN: 7.4 g/dL (ref 6.0–8.3)

## 2017-06-25 LAB — CBC WITH DIFFERENTIAL/PLATELET
BASOS ABS: 0 10*3/uL (ref 0.0–0.1)
Basophils Relative: 0.3 % (ref 0.0–3.0)
EOS PCT: 2.6 % (ref 0.0–5.0)
Eosinophils Absolute: 0.2 10*3/uL (ref 0.0–0.7)
HCT: 45.6 % (ref 39.0–52.0)
HEMOGLOBIN: 15.5 g/dL (ref 13.0–17.0)
LYMPHS ABS: 1.9 10*3/uL (ref 0.7–4.0)
Lymphocytes Relative: 28.1 % (ref 12.0–46.0)
MCHC: 34.1 g/dL (ref 30.0–36.0)
MCV: 92.3 fl (ref 78.0–100.0)
MONOS PCT: 6.9 % (ref 3.0–12.0)
Monocytes Absolute: 0.5 10*3/uL (ref 0.1–1.0)
NEUTROS PCT: 62.1 % (ref 43.0–77.0)
Neutro Abs: 4.1 10*3/uL (ref 1.4–7.7)
Platelets: 250 10*3/uL (ref 150.0–400.0)
RBC: 4.93 Mil/uL (ref 4.22–5.81)
RDW: 13.3 % (ref 11.5–15.5)
WBC: 6.7 10*3/uL (ref 4.0–10.5)

## 2017-06-25 LAB — TSH: TSH: 0.93 u[IU]/mL (ref 0.35–4.50)

## 2017-06-25 MED ORDER — SILDENAFIL CITRATE 20 MG PO TABS: ORAL_TABLET | ORAL | 5 refills | Status: DC

## 2017-06-25 MED ORDER — PANTOPRAZOLE SODIUM 40 MG PO TBEC: 40.0000 mg | DELAYED_RELEASE_TABLET | Freq: Every day | ORAL | 3 refills | Status: DC

## 2017-06-25 NOTE — Patient Instructions (Addendum)
Today we updated your med list in our EPIC system...     We wrote for PROTONIX (Pantoprazole) 31m -- take one tab daily about 361m before a meal...  We wrote for Sildenafil 2064m50 - take 1-5 tabs as needed for ED...    [facility] [place] (they are on the internet & you can call for the price comparison).  Today we checked your follow up CXR, EKG, & FASTING blood work...    We will contact you w/ the results when available...   We will arrange for an appt w/ one of our gastroenterologists for your swallowing difficulty...    In the meanwhile stay on the Protonix daily...  We will also arrange for a split night sleep study to check for obstructive sleep apnea (OSA)... Marland KitchenMarland Kitchen In the meanwhile try sleeping off your back on one side or the other to see if this helps  Call for any questions...  Let's plan a follow up visit in [id number] if needed for problems...  NOTE:  If indeed you do have OSA & we are able to start you on a CPAP machine for treatment, your insurance company will require you to return to see me in about 40mo 740moocument the efficacy of the treatment...Marland KitchenMarland Kitchen

## 2017-06-28 ENCOUNTER — Encounter: Payer: Self-pay | Admitting: Pulmonary Disease

## 2017-06-28 NOTE — Progress Notes (Addendum)
Subjective:    Patient ID: Thomas Koch, male    DOB: [birth year], 46 y.o.   MRN: [id number]  HPI 46 y/o WM here for a follow up visit and CPX... he is the son of Thomas Koch & Thomas Koch, he is essentially a non-smoker, and enjoys excellent general medical health... He is a Armed forces operational officer at [employer]...  ~  June 05, 2011:  2.5 yr ROV & CPX> he reports feeling well & has no new complaints or concerns; on no regular meds now but relates an interesting hx over the last year w/ unknown/ undiagnosed rheumatologic syndrome; presented 8/11 w/ right leg swelling and pain in his leg, knee, foot, & soft tissues; eval by Rheum DrBeekman ?etiology ?inflamm, ?infectious; Hosp by [facility] w/ right thigh pain> ?fasciitis, polyarthritis, raynauds; treated w/ Clinda, Pred, Benedryl;  Etiology remains obscure but the pt wonders about coffee & meds he was taking "I was very sensitive to caffeine" & all symptoms finally resolved off all meds & off all caffeine...  Now he works out regularly in gym, runs, etc> feeling well w/o residual effects...    CPX today w/ norm vital signs, neg exam, & normal CXR/ EKG/ Labs...  ~  Mar 02, 2013:  29moROV & CPX> TReginalddescribes a good interval w/o new complaints or concerns... He denies recurrent orthopedic symptoms- no back pain, shoulder discomfort, leg pain, etc... He tells me that he had a MELANOMA on his back w/ wide excision by [name] (Bx first, then wide excision)- he reports doing well & follows up w/ Derm Q337moe says...     We reviewed prob list, meds, xrays and labs> see below for updates >>  LABS 5/14:  FLP- at goals on diet alone;  Chems- wnl;  CBC- wnl;  TSH- 0.82;  UA- wnl/clear...   ~  December 12, 2014:  2190moV & CPX> TerAjeetports a good interval- feeling well w/o new complaints or concerns; he notes occas pain in his back w/ wt lifting and asked to take it easy & discuss w/ personal trainer (he uses musc relaxer & heat for relief);  His ROS is essentially neg- notes  occas reflux symptoms & we decided to treat w/ Protonix40;  He had a melanoma removed from his back & he continues to check w/ Derm Q6mo108mo  We reviewed prob list, meds, xrays and labs> see below for updates >>   CXR 3/16 showed norm heart size, clear lungs, NAD...  Marland KitchenMarland KitchenG 3/16 showed NSR, rate72, rsr' otherw neg/ NAD...  LABS 3/16:  FLP- wnl on diet;  Chems- wnl;  CBC- wnl;  TSH=0.94...   ~  June 25, 2017:  2.5 year ROV & CPX>  TerrImreback for a f/u CPX- prev worked at [employer] w/ his swallowing- noting dysphagia & food gets stuck in esop[place] & he has vomited up undigested food previously;  He also tells me that his signif other complains about hs snoring & he is willing to pursue the diagnosis w/ additional testing at this time...     He saw ENT-[name] 06/19/17> c/o dysphagia & globus sensation, hx pos for GERD/ food sticking in lower esoph/ & some vomiting; ENT exam revealed a deviated septum, tonsillar hypertrophy & tongue base hypertrophy;  REC Protonix40 before dinner, aggressive antireflux regimen... We reviewed the following medical problems during today's office visit >>     R/O OSA>  Pt says his signif other c/o  his snoring & restlessness; he denies daytime hypersomnolence, trouble w/ driving, etc; he has agreed to proceed w/ PSG...    Overweight>  He has weighed as much at 245#, currently 238#, 76"Tall, BMI=29...    GI- DYSPHAGIA>  As noted above- his CC is solid food dysphagia, he needs GI consult for EGD and dilatation...    GU> hx vasectomy age 54 and varicocele on left evaluated by [facility] at [facility] in 2013; he requests refill of Sildenafil for ED...    Hx rotator cuff repair & LBP in the past...    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    Hx MELANOMA removed from the left side of his back w/ wide excision by [name] in 2014, they still check him Q15moEXAM shows Afeb, VSS, O2sat=98% on RA;  HEENT- neg, mallampati2;  Chest- clear w/o w/r/r;  Heart- RR w/o m/r/g;  Abd- soft, nontender, neg;  Ext- neg w/o c/c/e;  Neuro- wnl, no focal abn...  CXR ordered 06/25/17> [facility]...  EKG done 06/25/17>  NSR, rate 65, rsr' in V1 otherw WNL, NAD...  LABS done 06/25/17>  FLP- all parameters are wnl on diet alone;  Chems- wnl;  CBC- wnl;  TSH=0.93...  Sleep Study> ordered & pending... IMP/PLAN>>  We reviewed his dysphagia & the need for GI consult/ EGD/ dilatation, we will set this up; in the meanwhile take the Protonix40 ~343m before dinner, NPO after dinner, elev HOB 6" for a vigorous antireflux regimen;  We will arrange for a PSG to eval his symptoms of OSA;  We reviewed diet/ exercise/ wt reduction strategies...  ADDENDUM>> Pt ret for CXR 07/18/17> norm heart size, clear lungs- NAD...           Problem List:    R/O OSA>>  Presented 06/2017 c/o SO complaints of snoring etc => he is willing to undergo Sleep Study for further eval...  OVERWEIGHT >> ~  Weight 5/14 = 241#, he is 76" tall, BMI= 29-30... ~  Weight 3/16 = 245#  Note: FLP, Liver panel, etc are all WNL... ~  Weight 9/18 = 238#  GI>  He presented 06/2017 w/ some intermittent solid food dysphagia w/ occas vomiting up undigested food=> we started Protonix40 ~3011mbefore dinner, NPO after dinner, elev HOB 6" and we will refer to GI for EGD & dilatation.  GU>  Hx Vasectomy age [age][facility]; on Sildenafil prn for ED...  Hx of right thigh pain/ swelling/ inflamm ?etiology 8/11> ?fasciitis treated w/ Antibiotics, Pred, Benedryl, etc but pt feeling this may have been caused by caffeine & finally resolved w/o residual effects off all meds & off  all caffeine...  ROTATOR CUFF REPAIR, HX OF (ICD-V45.89) Hx of BACK PAIN, LUMBAR (ICD-724.2) - on ROBAXIN 500m49md prn & TRAMADOL 50mg22m prn; neg lumbar spine films in 2008...  Hx Malignant Melanoma >> ~  He reports MM removed from the left side of his back by [name]> 1st w/ Bx, then w/ wide excision, & he continues regular f/u w/ derm (we do not have any notes from them)... ~  3/16: he continues to f/u w/ Derm Q6mo (44moo not have notes  from them)...    Past Medical History:  Diagnosis Date  . Lumbar back pain   . Physical exam, annual     Past Surgical History:  Procedure Laterality Date  . melanona removed  08/2012   on his upper back    Outpatient Encounter Prescriptions as of 06/25/2017  Medication Sig  . pantoprazole (PROTONIX) 40 MG tablet Take 1 tablet (40 mg total) by mouth daily. 30 minutes before a meal of the day  . sildenafil (REVATIO) 20 MG tablet Take 1 to 5 tablets by mouth as needed  . [DISCONTINUED] pantoprazole (PROTONIX) 40 MG tablet Take 1 tablet (40 mg total) by mouth daily.  . [DISCONTINUED] sildenafil (REVATIO) 20 MG tablet TAKE 3 TO 5 TABLETS AS NEEDED FOR E.D.  . [DISCONTINUED] methocarbamol (ROBAXIN) 500 MG tablet Take 1 tablet (500 mg total) by mouth 3 (three) times daily as needed for muscle spasms. (Patient not taking: Reported on 06/25/2017)  . [DISCONTINUED] tadalafil (CIALIS) 20 MG tablet Take 1 tablet (20 mg total) by mouth daily as needed for erectile dysfunction. (Patient not taking: Reported on 06/25/2017)   No facility-administered encounter medications on file as of 06/25/2017.     Allergies  Allergen Reactions  . Caffeine     REACTION: causes hives, itching Pt states he can tolerate small amounts of caffeine 12/12/2014  . Ibuprofen     REACTION: hives  . Penicillins     REACTION: rash    Immunization History  Administered Date(s) Administered  . Influenza Split 07/13/2014  . Influenza,inj,Quad PF,6+ Mos 06/25/2016    Current  Medications, Allergies, Past Medical History, Past Surgical History, Family History, and Social History were reviewed in [employer] record.   Review of Systems    The patient denies fever, chills, sweats, anorexia, fatigue, weakness, malaise, weight loss, sleep disorder, blurring, diplopia, eye irritation, eye discharge, vision loss, eye pain, photophobia, earache, ear discharge, tinnitus, decreased hearing, nasal congestion, nosebleeds, sore throat, hoarseness, chest pain, palpitations, syncope, dyspnea on exertion, orthopnea, PND, peripheral edema, cough, dyspnea at rest, excessive sputum, hemoptysis, wheezing, pleurisy, nausea, vomiting, diarrhea, constipation, change in bowel habits, abdominal pain, melena, hematochezia, jaundice, gas/bloating, indigestion/heartburn, dysphagia, odynophagia, dysuria, hematuria, urinary frequency, urinary hesitancy, nocturia, incontinence, back pain, joint pain, joint swelling, muscle cramps, muscle weakness, stiffness, arthritis, sciatica, restless legs, leg pain at night, leg pain with exertion, rash, itching, dryness, suspicious lesions, paralysis, paresthesias, seizures, tremors, vertigo, transient blindness, frequent falls, frequent headaches, difficulty walking, depression, anxiety, memory loss, confusion, cold intolerance, heat intolerance, polydipsia, polyphagia, polyuria, unusual weight change, abnormal bruising, bleeding, enlarged lymph nodes, urticaria, allergic rash, hay fever, and recurrent infections.    Objective:   Physical Exam     WD, WN, 46 y/o WM in NAD... GENERAL:  Alert & oriented; pleasant & cooperative. HEENT:  [place]/AT, EOM-wnl, PERRLA, Fundi-benign, EACs-clear, TMs-wnl, NOSE-clear, THROAT-clear & wnl. NECK:  Supple w/ full ROM; no JVD; normal carotid impulses w/o bruits; no thyromegaly or nodules palpated; no lymphadenopathy. CHEST:  Clear to P & A; without wheezes/ rales/ or rhonchi. BACK:  Scar toward left side of  upper back- from melanoma removal by [name]... HEART:  Regular Rhythm; without murmurs/ rubs/ or gallops. ABDOMEN:  Soft & nontender; normal bowel sounds; no organomegaly or masses detected. RECTAL:  Neg - prostate 2+ & nontender w/o nodules; stool hematest neg. EXT: without deformities or arthritic changes; no varicose veins/ venous insuffic/ or edema. NEURO:  CN's intact; motor testing normal; sensory testing normal; gait normal &  balance OK. DERM:  No lesions noted; no rash etc...  RADIOLOGY DATA:  Reviewed in the EPIC EMR & discussed w/ the patient...  LABORATORY DATA:  Reviewed in the EPIC EMR & discussed w/ the patient...   Assessment & Plan:    CPX>      R/O OSA w/ sleep study ordered...    We will proceed w/ GI consult for EGD & dilatation, start Protonix & antireflux regimen;  Overweight>  We reviewed diet, exercise, wt reduction strategies...  ORTHO>  Hx LBP, Rotator cuff tear, prev right thigh pain; he denies current issues, doing satis.....  Hx MELANOMA>  He reports that this was removed from the left side of his back in 2013 by [name] & they continue to follow (we do not have records)...   Patient's Medications  New Prescriptions   No medications on file  Previous Medications   No medications on file  Modified Medications   Modified Medication Previous Medication   PANTOPRAZOLE (PROTONIX) 40 MG TABLET pantoprazole (PROTONIX) 40 MG tablet      Take 1 tablet (40 mg total) by mouth daily. 30 minutes before a meal of the day    Take 1 tablet (40 mg total) by mouth daily.   SILDENAFIL (REVATIO) 20 MG TABLET sildenafil (REVATIO) 20 MG tablet      Take 1 to 5 tablets by mouth as needed    TAKE 3 TO 5 TABLETS AS NEEDED FOR E.D.  Discontinued Medications   METHOCARBAMOL (ROBAXIN) 500 MG TABLET    Take 1 tablet (500 mg total) by mouth 3 (three) times daily as needed for muscle spasms.   TADALAFIL (CIALIS) 20 MG TABLET    Take 1 tablet (20 mg total) by mouth daily as  needed for erectile dysfunction.

## 2017-07-17 ENCOUNTER — Encounter: Payer: Self-pay | Admitting: Gastroenterology

## 2017-07-17 ENCOUNTER — Ambulatory Visit (INDEPENDENT_AMBULATORY_CARE_PROVIDER_SITE_OTHER)
Admission: RE | Admit: 2017-07-17 | Discharge: 2017-07-17 | Disposition: A | Discharge status: Home / Self Care | Payer: BLUE CROSS/BLUE SHIELD | Source: Ambulatory Visit | Attending: Pulmonary Disease | Admitting: Pulmonary Disease

## 2017-07-17 ENCOUNTER — Ambulatory Visit (INDEPENDENT_AMBULATORY_CARE_PROVIDER_SITE_OTHER): Payer: BLUE CROSS/BLUE SHIELD | Admitting: Gastroenterology

## 2017-07-17 VITALS — BP 118/80 | HR 76 | Ht 75.5 in | Wt 238.2 lb

## 2017-07-17 DIAGNOSIS — Z0000 Encounter for general adult medical examination without abnormal findings: Secondary | ICD-10-CM | POA: Diagnosis not present

## 2017-07-17 DIAGNOSIS — R1310 Dysphagia, unspecified: Secondary | ICD-10-CM | POA: Diagnosis not present

## 2017-07-17 DIAGNOSIS — R1319 Other dysphagia: Secondary | ICD-10-CM

## 2017-07-17 NOTE — Progress Notes (Signed)
[facility] Consult Note:  History: Thomas Koch 07/17/2017  Referring physician: Noralee Space, MD  Reason for consult/chief complaint: Dysphagia (when eating having to drink a lot of water to get foods down, rice carrots)   Subjective  HPI:  This is a 46 year old man referred by primary care noted above for years of dysphagia. It has occurred for at least 5 or 6 years, but seems more frequent within the last year. It always occurs with either meat or rice, were all feel stuck in the lower chest. He might have to go to the bathroom and bring it back up, which occurred about a month ago. He was given a trial of PPI after an ENT visit, and also after seeing someone about this years ago, but he is never really sure if that made things better. It sounds like he rarely has pyrosis, and he denies nausea, vomiting, early satiety or weight loss.  He does admit to occasionally eating late in the evening due to his work.  ROS:  Review of Systems  Constitutional: Negative for appetite change and unexpected weight change.  HENT: Negative for mouth sores and voice change.   Eyes: Negative for pain and redness.  Respiratory: Negative for cough and shortness of breath.   Cardiovascular: Negative for chest pain and palpitations.  Genitourinary: Negative for dysuria and hematuria.  Musculoskeletal: Negative for arthralgias and myalgias.  Skin: Negative for pallor and rash.  Neurological: Negative for weakness and headaches.  Hematological: Negative for adenopathy.     Past Medical History: Past Medical History:  Diagnosis Date  . Lumbar back pain   . Physical exam, annual      Past Surgical History: Past Surgical History:  Procedure Laterality Date  . melanona removed  08/2012   on his upper back   He saw ENT (Dr. Wilburn Cornelia) last month for dysphagia, and was advised to start once daily Protonix.  Family History: Family History  Problem Relation Age of  Onset  . COPD Father   . Hyperlipidemia Father   . Hypertension Father   . Osteoporosis Mother   . Colon polyps Mother   . Hyperlipidemia Mother   . Heart attack Maternal Grandfather 65  . Diabetes Paternal Grandmother   . Heart disease Paternal Grandmother   . Heart disease Paternal Grandfather     Social History: Social History   Social History  . Marital status: Married    Spouse name: Burr Soffer  . Number of children: 1  . Years of education: N/A   Occupational History  . office worker    Social History Main Topics  . Smoking status: Never Smoker  . Smokeless tobacco: Never Used  . Alcohol use 0.0 oz/week     Comment: social use  . Drug use: No  . Sexual activity: Yes    Partners: Female   Other Topics Concern  . None   Social History Narrative  . None    Allergies: Allergies  Allergen Reactions  . Caffeine     REACTION: causes hives, itching Pt states he can tolerate small amounts of caffeine 12/12/2014  . Ibuprofen     REACTION: hives  . Penicillins     REACTION: rash    Outpatient Meds: Current Outpatient Prescriptions  Medication Sig Dispense Refill  . pantoprazole (PROTONIX) 40 MG tablet Take 1 tablet (40 mg total) by mouth daily. 30 minutes before a meal of the day 90 tablet 3  . sildenafil (REVATIO) 20 MG tablet  Take 1 to 5 tablets by mouth as needed 50 tablet 5   No current facility-administered medications for this visit.       ___________________________________________________________________ Objective   Exam:  BP 118/80 (BP Location: Left Arm, Patient Position: Sitting, Cuff Size: Normal)   Pulse 76   Ht 6' 3.5" (1.918 m) Comment: height measured without shoes  Wt 238 lb 4 oz (108.1 kg)   BMI 29.39 kg/m    General: this is a(n) Well-appearing man   Eyes: sclera anicteric, no redness  ENT: oral mucosa moist without lesions, no cervical or supraclavicular lymphadenopathy, good dentition  CV: RRR without murmur, S1/S2,  no JVD, no peripheral edema  Resp: clear to auscultation bilaterally, normal RR and effort noted  GI: soft, no tenderness, with active bowel sounds. No guarding or palpable organomegaly noted.  Skin; warm and dry, no rash or jaundice noted  Neuro: awake, alert and oriented x 3. Normal gross motor function and fluent speech  Assessment: Encounter Diagnosis  Name Primary?  . Esophageal dysphagia Yes    Possible mechanical lesion such as stricture or ring, possible eosinophilic esophagitis, less likely achalasia or malignancy.  Plan:  Upper endoscopy with probable dilation. He is agreeable.  The benefits and risks of the planned procedure were described in detail with the patient or (when appropriate) their health care proxy.  Risks were outlined as including, but not limited to, bleeding, infection, perforation, adverse medication reaction leading to cardiac or pulmonary decompensation, or pancreatitis (if ERCP).  The limitation of incomplete mucosal visualization was also discussed.  No guarantees or warranties were given.   Thank you for the courtesy of this consult.  Please call me with any questions or concerns.  Nelida Meuse III  CC: Noralee Space, MD

## 2017-07-17 NOTE — Patient Instructions (Signed)
If you are age 47 or older, your body mass index should be between 23-30. Your Body mass index is 29.39 kg/m. If this is out of the aforementioned range listed, please consider follow up with your Primary Care Provider.  If you are age 47 or younger, your body mass index should be between 19-25. Your Body mass index is 29.39 kg/m. If this is out of the aformentioned range listed, please consider follow up with your Primary Care Provider.   You have been scheduled for an endoscopy. Please follow written instructions given to you at your visit today. If you use inhalers (even only as needed), please bring them with you on the day of your procedure. Your physician has requested that you go to www.startemmi.com and enter the access code given to you at your visit today. This web site gives a general overview about your procedure. However, you should still follow specific instructions given to you by our office regarding your preparation for the procedure.  Thank you for choosing [facility]  Dr Wilfrid Lund III

## 2017-07-20 ENCOUNTER — Encounter: Payer: Self-pay | Admitting: Gastroenterology

## 2017-08-04 ENCOUNTER — Ambulatory Visit (AMBULATORY_SURGERY_CENTER): Payer: BLUE CROSS/BLUE SHIELD | Admitting: Gastroenterology

## 2017-08-04 ENCOUNTER — Encounter: Payer: Self-pay | Admitting: Gastroenterology

## 2017-08-04 VITALS — BP 113/72 | HR 56 | Temp 97.5°F | Resp 9 | Ht 75.0 in | Wt 238.0 lb

## 2017-08-04 DIAGNOSIS — K228 Other specified diseases of esophagus: Secondary | ICD-10-CM | POA: Diagnosis not present

## 2017-08-04 DIAGNOSIS — R1310 Dysphagia, unspecified: Secondary | ICD-10-CM | POA: Diagnosis not present

## 2017-08-04 DIAGNOSIS — K229 Disease of esophagus, unspecified: Secondary | ICD-10-CM | POA: Diagnosis not present

## 2017-08-04 DIAGNOSIS — R1319 Other dysphagia: Secondary | ICD-10-CM

## 2017-08-04 MED ORDER — SODIUM CHLORIDE 0.9 % IV SOLN: 500.0000 mL | INTRAVENOUS | Status: DC

## 2017-08-04 NOTE — Op Note (Signed)
[facility] Patient Name: Thomas Koch Procedure Date: 08/04/2017 9:39 AM MRN: [id number] Endoscopist: [place]. Loletha Carrow , MD Age: 46 Referring MD:  Date of Birth: 31-Jan-1971 Gender: Male Account #: [id number] Procedure:                Upper GI endoscopy Indications:              Esophageal dysphagia Medicines:                Monitored Anesthesia Care Procedure:                Pre-Anesthesia Assessment:                           - Prior to the procedure, a History and Physical                            was performed, and patient medications and                            allergies were reviewed. The patient's tolerance of                            previous anesthesia was also reviewed. The risks                            and benefits of the procedure and the sedation                            options and risks were discussed with the patient.                            All questions were answered, and informed consent                            was obtained. Prior Anticoagulants: The patient has                            taken no previous anticoagulant or antiplatelet                            agents. ASA Grade Assessment: II - A patient with                            mild systemic disease. After reviewing the risks                            and benefits, the patient was deemed in                            satisfactory condition to undergo the procedure.                           After obtaining informed consent, the endoscope was  passed under direct vision. Throughout the                            procedure, the patient's blood pressure, pulse, and                            oxygen saturations were monitored continuously. The                            Model GIF-HQ190 [id number]) scope was introduced                            through the mouth, and advanced to the second part                            of duodenum. The upper GI  endoscopy was                            accomplished without difficulty. The patient                            tolerated the procedure well. Scope In: Scope Out: Findings:                 The larynx was normal.                           Mucosal changes including longitudinal furrows were                            found in the middle third of the esophagus and in                            the lower third of the esophagus. Esophageal                            findings were graded using the Eosinophilic                            Esophagitis Endoscopic Reference Score (EoE-EREFS)                            as: Edema Grade 0 Normal (distinct vascular                            markings), Rings Grade 0 None (no ridges or rings                            seen), Exudates Grade 1 Mild (scattered white                            lesions involving less than 10 percent of the                            esophageal  surface area), Furrows Grade 1 Present                            (vertical lines with or without visible depth) and                            Stricture none (no stricture found). Four biopsies                            were obtained with cold forceps for histology in                            the lower third of the esophagus, as well as four                            biopsies in the middle third of the esophagus.                           There was no apparent esophageal dilatation, and                            there was no resistance passing the scope through                            the EGJ. No stricture was seen.                           The stomach was normal.                           The cardia and gastric fundus were normal on                            retroflexion.                           The examined duodenum was normal. Complications:            No immediate complications. Estimated Blood Loss:     Estimated blood loss was minimal. Impression:               -  Normal larynx.                           - Esophageal mucosal changes suggestive of (but not                            certain for) eosinophilic esophagitis.                           - Normal stomach.                           - Normal examined duodenum.                           -  Biopsies performed in the lower third of the                            esophagus and in the middle third of the esophagus. Recommendation:           - Patient has a contact number available for                            emergencies. The signs and symptoms of potential                            delayed complications were discussed with the                            patient. Return to normal activities tomorrow.                            Written discharge instructions were provided to the                            patient.                           - Resume previous diet.                           - Continue present medications.                           - Await pathology results. Ladan Vanderzanden L. Loletha Carrow, MD 08/04/2017 9:57:37 AM This report has been signed electronically.

## 2017-08-04 NOTE — Progress Notes (Signed)
Spontaneous respirations throughout. VSS. Resting comfortably. To PACU on room air. Report to RN.

## 2017-08-04 NOTE — Progress Notes (Signed)
Pt's states no medical or surgical changes since previsit or office visit. 

## 2017-08-04 NOTE — Patient Instructions (Signed)
  Await biopsy results   Continue current medications and diet    YOU HAD AN ENDOSCOPIC PROCEDURE TODAY AT [facility]:   Refer to the procedure report that was given to you for any specific questions about what was found during the examination.  If the procedure report does not answer your questions, please call your gastroenterologist to clarify.  If you requested that your care partner not be given the details of your procedure findings, then the procedure report has been included in a sealed envelope for you to review at your convenience later.  YOU SHOULD EXPECT: Some feelings of bloating in the abdomen. Passage of more gas than usual.  Walking can help get rid of the air that was put into your GI tract during the procedure and reduce the bloating. If you had a lower endoscopy (such as a colonoscopy or flexible sigmoidoscopy) you may notice spotting of blood in your stool or on the toilet paper. If you underwent a bowel prep for your procedure, you may not have a normal bowel movement for a few days.  Please Note:  You might notice some irritation and congestion in your nose or some drainage.  This is from the oxygen used during your procedure.  There is no need for concern and it should clear up in a day or so.  SYMPTOMS TO REPORT IMMEDIATELY:     Following upper endoscopy (EGD)  Vomiting of blood or coffee ground material  New chest pain or pain under the shoulder blades  Painful or persistently difficult swallowing  New shortness of breath  Fever of 100F or higher  Black, tarry-looking stools  For urgent or emergent issues, a gastroenterologist can be reached at any hour by calling [phone].   DIET:  We do recommend a small meal at first, but then you may proceed to your regular diet.  Drink plenty of fluids but you should avoid alcoholic beverages for 24 hours.  ACTIVITY:  You should plan to take it easy for the rest of today and you should NOT DRIVE or  use heavy machinery until tomorrow (because of the sedation medicines used during the test).    FOLLOW UP: Our staff will call the number listed on your records the next business day following your procedure to check on you and address any questions or concerns that you may have regarding the information given to you following your procedure. If we do not reach you, we will leave a message.  However, if you are feeling well and you are not experiencing any problems, there is no need to return our call.  We will assume that you have returned to your regular daily activities without incident.  If any biopsies were taken you will be contacted by phone or by letter within the next 1-3 weeks.  Please call us at [phone] if you have not heard about the biopsies in 3 weeks.    SIGNATURES/CONFIDENTIALITY: You and/or your care partner have signed paperwork which will be entered into your electronic medical record.  These signatures attest to the fact that that the information above on your After Visit Summary has been reviewed and is understood.  Full responsibility of the confidentiality of this discharge information lies with you and/or your care-partner.

## 2017-08-04 NOTE — Progress Notes (Signed)
Called to room to assist during endoscopic procedure.  Patient ID and intended procedure confirmed with present staff. Received instructions for my participation in the procedure from the performing physician.  

## 2017-08-05 ENCOUNTER — Telehealth: Payer: Self-pay

## 2017-08-05 NOTE — Telephone Encounter (Signed)
  Follow up Call-  Call back number 08/04/2017  Post procedure Call Back phone  # [phone]  Permission to leave phone message Yes  Some recent data might be hidden     Patient questions:  Do you have a fever, pain , or abdominal swelling? No. Pain Score  0 *  Have you tolerated food without any problems? Yes.    Have you been able to return to your normal activities? Yes.    Do you have any questions about your discharge instructions: Diet   No. Medications  No. Follow up visit  No.  Do you have questions or concerns about your Care? No.  Actions: * If pain score is 4 or above: No action needed, pain <4.

## 2017-08-07 ENCOUNTER — Other Ambulatory Visit: Payer: Self-pay

## 2017-08-07 MED ORDER — OMEPRAZOLE 40 MG PO CPDR: 40.0000 mg | DELAYED_RELEASE_CAPSULE | Freq: Two times a day (BID) | ORAL | 1 refills | Status: DC

## 2017-08-11 ENCOUNTER — Ambulatory Visit (HOSPITAL_BASED_OUTPATIENT_CLINIC_OR_DEPARTMENT_OTHER): Payer: BLUE CROSS/BLUE SHIELD | Attending: Pulmonary Disease | Admitting: Pulmonary Disease

## 2017-08-11 ENCOUNTER — Other Ambulatory Visit: Payer: Self-pay

## 2017-08-11 VITALS — Ht 75.0 in | Wt 235.0 lb

## 2017-08-11 DIAGNOSIS — G4730 Sleep apnea, unspecified: Secondary | ICD-10-CM

## 2017-08-11 DIAGNOSIS — R0683 Snoring: Secondary | ICD-10-CM | POA: Diagnosis not present

## 2017-08-11 DIAGNOSIS — G4733 Obstructive sleep apnea (adult) (pediatric): Secondary | ICD-10-CM | POA: Insufficient documentation

## 2017-08-11 DIAGNOSIS — K200 Eosinophilic esophagitis: Secondary | ICD-10-CM

## 2017-08-13 DIAGNOSIS — R0683 Snoring: Secondary | ICD-10-CM | POA: Diagnosis not present

## 2017-08-13 DIAGNOSIS — G4730 Sleep apnea, unspecified: Secondary | ICD-10-CM | POA: Insufficient documentation

## 2017-08-13 NOTE — Procedures (Signed)
   Patient Name: Thomas Koch, Thomas Koch Date: 08/11/2017 Gender: Male D.O.B: [birth year] Age (years): 46 Referring Provider: Teressa Lower Height (inches): 81 Interpreting Physician: Chesley Mires MD, ABSM Weight (lbs): 235 RPSGT: Zadie Rhine BMI: 29 MRN: [id number] Neck Size: 16.50  CLINICAL INFORMATION Sleep Study Type: NPSG  Indication for sleep study: Snoring  Epworth Sleepiness Score: 2  SLEEP STUDY TECHNIQUE As per the AASM Manual for the Scoring of Sleep and Associated Events v2.3 (April 2016) with a hypopnea requiring 4% desaturations.  The channels recorded and monitored were frontal, central and occipital EEG, electrooculogram (EOG), submentalis EMG (chin), nasal and oral airflow, thoracic and abdominal wall motion, anterior tibialis EMG, snore microphone, electrocardiogram, and pulse oximetry.  MEDICATIONS Medications self-administered by patient taken the night of the study : [place] The study was initiated at 9:46:21 PM and ended at 3:48:48 AM.  Sleep onset time was 6.2 minutes and the sleep efficiency was 73.4%. The total sleep time was 266.0 minutes.  Stage REM latency was 137.0 minutes.  The patient spent 16.17% of the night in stage N1 sleep, 57.71% in stage N2 sleep, 1.32% in stage N3 and 24.81% in REM.  Alpha intrusion was absent.  Supine sleep was 69.62%.  RESPIRATORY PARAMETERS The overall respiratory disturbance index (RDI) was 12.6 per hour. There were 8 total apneas, including 2 obstructive, 5 central and 1 mixed apneas. There were 4 hypopneas and 44 RERAs.  The AHI during Stage REM sleep was 3.6 per hour.  AHI while supine was 3.2 per hour.  The mean oxygen saturation was 92.07%. The minimum SpO2 during sleep was 87.00%.  soft snoring was noted during this study.  CARDIAC DATA The 2 lead EKG demonstrated sinus rhythm. The mean heart rate was 54.33 beats per minute. Other EKG findings include: None.  LEG  MOVEMENT DATA The total PLMS were 72 with a resulting PLMS index of 16.24. Associated arousal with leg movement index was 0.2 .  IMPRESSIONS - This study shows mild sleep apnea with an RDI of 12.6 and SpO2 of 87%.  DIAGNOSIS - Sleep apnea, unspecified (G47.30)  RECOMMENDATIONS - Additional therapies include weight loss, CPAP, oral appliance, or surgical assessment.  [Electronically signed] 08/13/2017 01:58 PM  Chesley Mires MD, [place], American Board of Sleep Medicine   NPI: [id number]

## 2017-09-01 ENCOUNTER — Encounter: Payer: Self-pay | Admitting: Allergy and Immunology

## 2017-09-29 ENCOUNTER — Ambulatory Visit: Payer: BLUE CROSS/BLUE SHIELD | Admitting: Gastroenterology

## 2017-09-29 ENCOUNTER — Telehealth: Payer: Self-pay | Admitting: Gastroenterology

## 2017-09-29 NOTE — Telephone Encounter (Signed)
Looked back at referral made to allergist on 10/30, they tried twice to contact him by phone, sent letter on 11/20. Called patient today, after second attempt, was able to talk to him and let him know that they have been trying to schedule him. I gave him their phone number to call and set up a consult. He did receive their letter, but unfortunately it was misplaced.

## 2017-09-29 NOTE — Telephone Encounter (Signed)
Noted  

## 2017-10-21 DIAGNOSIS — Z85820 Personal history of malignant melanoma of skin: Secondary | ICD-10-CM | POA: Diagnosis not present

## 2017-10-21 DIAGNOSIS — D2261 Melanocytic nevi of right upper limb, including shoulder: Secondary | ICD-10-CM | POA: Diagnosis not present

## 2017-10-21 DIAGNOSIS — L813 Cafe au lait spots: Secondary | ICD-10-CM | POA: Diagnosis not present

## 2017-10-21 DIAGNOSIS — Z85828 Personal history of other malignant neoplasm of skin: Secondary | ICD-10-CM | POA: Diagnosis not present

## 2018-01-11 ENCOUNTER — Encounter: Payer: Self-pay | Admitting: Allergy and Immunology

## 2018-01-11 ENCOUNTER — Ambulatory Visit: Payer: BLUE CROSS/BLUE SHIELD | Admitting: Allergy and Immunology

## 2018-01-11 VITALS — BP 128/80 | HR 64 | Temp 98.1°F | Resp 16 | Ht 76.2 in | Wt 242.2 lb

## 2018-01-11 DIAGNOSIS — J302 Other seasonal allergic rhinitis: Secondary | ICD-10-CM | POA: Diagnosis not present

## 2018-01-11 DIAGNOSIS — K200 Eosinophilic esophagitis: Secondary | ICD-10-CM

## 2018-01-11 DIAGNOSIS — T7800XA Anaphylactic reaction due to unspecified food, initial encounter: Secondary | ICD-10-CM | POA: Insufficient documentation

## 2018-01-11 DIAGNOSIS — H1010 Acute atopic conjunctivitis, unspecified eye: Secondary | ICD-10-CM | POA: Insufficient documentation

## 2018-01-11 DIAGNOSIS — T7800XD Anaphylactic reaction due to unspecified food, subsequent encounter: Secondary | ICD-10-CM | POA: Diagnosis not present

## 2018-01-11 DIAGNOSIS — H1013 Acute atopic conjunctivitis, bilateral: Secondary | ICD-10-CM | POA: Diagnosis not present

## 2018-01-11 MED ORDER — FLUTICASONE PROPIONATE 50 MCG/ACT NA SUSP
1.0000 | Freq: Two times a day (BID) | NASAL | 5 refills | Status: DC | PRN
Start: 2018-01-11 — End: 2018-06-28

## 2018-01-11 MED ORDER — OLOPATADINE HCL 0.7 % OP SOLN: 1.0000 [drp] | OPHTHALMIC | 5 refills | Status: DC

## 2018-01-11 MED ORDER — LEVOCETIRIZINE DIHYDROCHLORIDE 5 MG PO TABS: 5.0000 mg | ORAL_TABLET | Freq: Every evening | ORAL | 5 refills | Status: DC

## 2018-01-11 MED ORDER — AUVI-Q 0.3 MG/0.3ML IJ SOAJ: 0.3000 mg | Freq: Once | INTRAMUSCULAR | 3 refills | Status: AC

## 2018-01-11 NOTE — Assessment & Plan Note (Addendum)
Food allergen skin tests today revealed reactivity to peanut, hazelnut, navy bean, mushroom, onion, cabbage, and cucumber.  One limitations of food allergy skin testing is the potential for false positive results.  However, if food elimination diet with gradual reintroduction may be beneficial in identifying true culprit.  Proceed with a trial of eliminating nuts, beans, mushroom, onion, cabbage, and cucumber for the diet for the next 6 weeks.  If symptoms improve, reintroduce these foods one at a time at 1 or 2-week intervals to help identify culprits.  Nuts should be the last foods to be reintroduced into the diet as these are the most allergenic foods of the list.  If the step above is unrevealing, consider trial of 6 food elimination diet including: soy, milk, eggs, shellfish, nuts, wheat.  If symptoms improve over the next several weeks, these foods may be reintroduced one at a time at 1 or 2-week intervals.  Cow's milk should be the last food to be reintroduced into the diet as it is the most common culprit.  As he does not have a history of anaphylaxis with any of the foods listed above, at most 1 or more these foods may be contributing to inflammation of the esophagus.  However, for caution sake a food allergy action plan has been provided and a prescription has been provided for epinephrine 0.3 mg autoinjector 2 pack along with instructions for its proper administration.  Follow-up with gastroenterology for further treatment options and monitoring.

## 2018-01-11 NOTE — Patient Instructions (Addendum)
Eosinophilic esophagitis Food allergen skin tests today revealed reactivity to peanut, hazelnut, navy bean, mushroom, onion, cabbage, and cucumber.  One limitations of food allergy skin testing is the potential for false positive results.  However, if food elimination diet with gradual reintroduction may be beneficial in identifying true culprit.  Proceed with a trial of eliminating nuts, beans, mushroom, onion, cabbage, and cucumber for the diet for the next 6 weeks.  If symptoms improve, reintroduce these foods one at a time at 1 or 2-week intervals to help identify culprits.  Nuts should be the last foods to be reintroduced into the diet as these are the most allergenic foods of the list.  If the step above is unrevealing, consider trial of 6 food elimination diet including: soy, milk, eggs, shellfish, nuts, wheat.  If symptoms improve over the next several weeks, these foods may be reintroduced one at a time at 1 or 2-week intervals.  Cow's milk should be the last food to be reintroduced into the diet as it is the most common culprit.  As he does not have a history of anaphylaxis with any of the foods listed above, at most 1 or more these foods may be contributing to inflammation of the esophagus.  However, for caution sake a food allergy action plan has been provided and a prescription has been provided for epinephrine 0.3 mg autoinjector 2 pack along with instructions for its proper administration.  Follow-up with gastroenterology for further treatment options and monitoring.  Seasonal allergic rhinitis  Aeroallergen avoidance measures have been discussed and provided in written form.  A prescription has been provided for levocetirizine, 5 mg daily as needed.  A prescription has been provided for fluticasone nasal spray, one spray per nostril 1-2 times daily as needed. Proper nasal spray technique has been discussed and demonstrated.  Nasal saline spray (i.e. Simply Saline) is recommended  prior to medicated nasal sprays and as needed.  Allergic conjunctivitis  Treatment plan as outlined above for allergic rhinitis.  A prescription has been provided for Pazeo, one drop per eye daily as needed.  I have also recommended eye lubricant drops (i.e., Natural Tears) as needed.   Reducing Pollen Exposure  The American Academy of Allergy, Asthma and Immunology suggests the following steps to reduce your exposure to pollen during allergy seasons.    1. Do not hang sheets or clothing out to dry; pollen may collect on these items. 2. Do not mow lawns or spend time around freshly cut grass; mowing stirs up pollen. 3. Keep windows closed at night.  Keep car windows closed while driving. 4. Minimize morning activities outdoors, a time when pollen counts are usually at their highest. 5. Stay indoors as much as possible when pollen counts or humidity is high and on windy days when pollen tends to remain in the air longer. 6. Use air conditioning when possible.  Many air conditioners have filters that trap the pollen spores. 7. Use a HEPA room air filter to remove pollen form the indoor air you breathe.

## 2018-01-11 NOTE — Progress Notes (Signed)
New Patient Note  RE: Thomas Koch MRN: [id number] DOB: [birth year] Date of Office Visit: 01/11/2018  Referring provider: Noralee Space, MD Primary care provider: Noralee Space, MD  Chief Complaint: Eosinophilic esophagitis   History of present illness: Thomas Koch is a 47 y.o. male seen today in consultation requested by Teressa Lower, MD.  He has been experiencing dysphagia for the past 5 or 6 years.  This occurs with many foods, however meat and rice are the most problematic and consistent culprits.  He had an EGD in October 2018 revealing eosinophilic esophagitis.  He was started on high-dose proton pump inhibitors but did not perceive significant benefit and so discontinued this therapy.  He is currently experiencing dysphasia with almost every meal and attempts to ameliorate the situation by chewing as well as he can and drinking a lot of water with meals.  He has never experienced anaphylactic symptoms with the consumption of any food.  He experiences nasal congestion, rhinorrhea, sneezing, postnasal drainage, and ocular pruritus.  These symptoms occur year around but tend to be more frequent and severe with pollen exposure.  Approximately 10 years ago he developed mild swelling of his gums while using cinnamon toothpaste.  He discontinued this this toothpaste and the problem went away.  He notes that he is able to consume cinnamon without adverse symptoms.   Assessment and plan: Eosinophilic esophagitis Food allergen skin tests today revealed reactivity to peanut, hazelnut, navy bean, mushroom, onion, cabbage, and cucumber.  One limitations of food allergy skin testing is the potential for false positive results.  However, if food elimination diet with gradual reintroduction may be beneficial in identifying true culprit.  Proceed with a trial of eliminating nuts, beans, mushroom, onion, cabbage, and cucumber for the diet for the next 6 weeks.  If symptoms improve, reintroduce these  foods one at a time at 1 or 2-week intervals to help identify culprits.  Nuts should be the last foods to be reintroduced into the diet as these are the most allergenic foods of the list.  If the step above is unrevealing, consider trial of 6 food elimination diet including: soy, milk, eggs, shellfish, nuts, wheat.  If symptoms improve over the next several weeks, these foods may be reintroduced one at a time at 1 or 2-week intervals.  Cow's milk should be the last food to be reintroduced into the diet as it is the most common culprit.  As he does not have a history of anaphylaxis with any of the foods listed above, at most 1 or more these foods may be contributing to inflammation of the esophagus.  However, for caution sake a food allergy action plan has been provided and a prescription has been provided for epinephrine 0.3 mg autoinjector 2 pack along with instructions for its proper administration.  Follow-up with gastroenterology for further treatment options and monitoring.  Seasonal allergic rhinitis  Aeroallergen avoidance measures have been discussed and provided in written form.  A prescription has been provided for levocetirizine, 5 mg daily as needed.  A prescription has been provided for fluticasone nasal spray, one spray per nostril 1-2 times daily as needed. Proper nasal spray technique has been discussed and demonstrated.  Nasal saline spray (i.e. Simply Saline) is recommended prior to medicated nasal sprays and as needed.  Allergic conjunctivitis  Treatment plan as outlined above for allergic rhinitis.  A prescription has been provided for Pazeo, one drop per eye daily as needed.  I have also recommended  eye lubricant drops (i.e., Natural Tears) as needed.   Meds ordered this encounter  Medications  . levocetirizine (XYZAL) 5 MG tablet    Sig: Take 1 tablet (5 mg total) by mouth every evening.    Dispense:  30 tablet    Refill:  5  . fluticasone (FLONASE) 50 MCG/ACT  nasal spray    Sig: Place 1 spray into both nostrils 2 (two) times daily as needed for allergies or rhinitis.    Dispense:  16 g    Refill:  5  . Olopatadine HCl (PAZEO) 0.7 % SOLN    Sig: Place 1 drop into both eyes 1 day or 1 dose.    Dispense:  1 Bottle    Refill:  5  . AUVI-Q 0.3 MG/0.3ML SOAJ injection    Sig: Inject 0.3 mLs (0.3 mg total) into the muscle once for 1 dose.    Dispense:  2 Device    Refill:  3    [phone] (M)    Diagnostics: Environmental skin testing: Positive to grass pollen, weed pollen, and tree pollen. Food allergen skin testing: Borderline positive to peanut, hazelnut, navy bean, mushroom, onion, cabbage, and cucumber.    Physical examination: Blood pressure 128/80, pulse 64, temperature 98.1 F (36.7 C), temperature source Oral, resp. rate 16, height 6' 4.2" (1.935 m), weight 242 lb 3.2 oz (109.9 kg).  General: Alert, interactive, in no acute distress. HEENT: TMs pearly gray, turbinates mildly edematous without discharge, post-pharynx moderately erythematous. Neck: Supple without lymphadenopathy. Lungs: Clear to auscultation without wheezing, rhonchi or rales. CV: Normal S1, S2 without murmurs. Abdomen: Nondistended, nontender. Skin: Warm and dry, without lesions or rashes. Extremities:  No clubbing, cyanosis or edema. Neuro:   Grossly intact.  Review of systems:  Review of systems negative except as noted in HPI / PMHx or noted below: Review of Systems  Constitutional: Negative.   HENT: Negative.   Eyes: Negative.   Respiratory: Negative.   Cardiovascular: Negative.   Gastrointestinal: Negative.   Genitourinary: Negative.   Musculoskeletal: Negative.   Skin: Negative.   Neurological: Negative.   Endo/Heme/Allergies: Negative.   Psychiatric/Behavioral: Negative.     Past medical history:  Past Medical History:  Diagnosis Date  . Lumbar back pain   . Physical exam, annual     Past surgical history: a Past Surgical History:    Procedure Laterality Date  . melanona removed  08/2012   on his upper back    Family history: Family History  Problem Relation Age of Onset  . COPD Father   . Hyperlipidemia Father   . Hypertension Father   . Osteoporosis Mother   . Colon polyps Mother   . Hyperlipidemia Mother   . Heart attack Maternal Grandfather 65  . Diabetes Paternal Grandmother   . Heart disease Paternal Grandmother   . Heart disease Paternal Grandfather     Social history: Social History   Socioeconomic History  . Marital status: Married    Spouse name: Jovin Fester  . Number of children: 1  . Years of education: Not on file  . Highest education level: Not on file  Occupational History  . Occupation: Sales promotion account executive  Social Needs  . Financial resource strain: Not on file  . Food insecurity:    Worry: Not on file    Inability: Not on file  . Transportation needs:    Medical: Not on file    Non-medical: Not on file  Tobacco Use  . Smoking status: Never Smoker  .  Smokeless tobacco: Never Used  Substance and Sexual Activity  . Alcohol use: Yes    Alcohol/week: 0.0 oz    Comment: social use  . Drug use: No  . Sexual activity: Yes    Partners: Female  Lifestyle  . Physical activity:    Days per week: Not on file    Minutes per session: Not on file  . Stress: Not on file  Relationships  . Social connections:    Talks on phone: Not on file    Gets together: Not on file    Attends religious service: Not on file    Active member of club or organization: Not on file    Attends meetings of clubs or organizations: Not on file    Relationship status: Not on file  . Intimate partner violence:    Fear of current or ex partner: Not on file    Emotionally abused: Not on file    Physically abused: Not on file    Forced sexual activity: Not on file  Other Topics Concern  . Not on file  Social History Narrative  . Not on file   Environmental History: The patient lives in a 48 year old house  with hardwood floors throughout and central air/heat.  He is a non-smoker without pets.  There is no known mold/water damage in the home.  Allergies as of 01/11/2018      Reactions   Caffeine    REACTION: causes hives, itching Pt states he can tolerate small amounts of caffeine 12/12/2014   Ibuprofen    REACTION: hives   Penicillins    REACTION: rash      Medication List        Accurate as of 01/11/18  7:05 PM. Always use your most recent med list.          AUVI-Q 0.3 mg/0.3 mL Soaj injection Generic drug:  EPINEPHrine Inject 0.3 mLs (0.3 mg total) into the muscle once for 1 dose.   fluticasone 50 MCG/ACT nasal spray Commonly known as:  FLONASE Place 1 spray into both nostrils 2 (two) times daily as needed for allergies or rhinitis.   levocetirizine 5 MG tablet Commonly known as:  XYZAL Take 1 tablet (5 mg total) by mouth every evening.   Olopatadine HCl 0.7 % Soln Commonly known as:  PAZEO Place 1 drop into both eyes 1 day or 1 dose.   sildenafil 20 MG tablet Commonly known as:  REVATIO Take 1 to 5 tablets by mouth as needed       Known medication allergies: Allergies  Allergen Reactions  . Caffeine     REACTION: causes hives, itching Pt states he can tolerate small amounts of caffeine 12/12/2014  . Ibuprofen     REACTION: hives  . Penicillins     REACTION: rash    I appreciate the opportunity to take part in Dorsey's care. Please do not hesitate to contact me with questions.  Sincerely,   R. Edgar Frisk, MD

## 2018-01-11 NOTE — Assessment & Plan Note (Signed)
   Treatment plan as outlined above for allergic rhinitis.  A prescription has been provided for Pazeo, one drop per eye daily as needed.  I have also recommended eye lubricant drops (i.e., Natural Tears) as needed.

## 2018-01-11 NOTE — Assessment & Plan Note (Signed)
   Aeroallergen avoidance measures have been discussed and provided in written form.  A prescription has been provided for levocetirizine, 5 mg daily as needed.  A prescription has been provided for fluticasone nasal spray, one spray per nostril 1-2 times daily as needed. Proper nasal spray technique has been discussed and demonstrated.  Nasal saline spray (i.e. Simply Saline) is recommended prior to medicated nasal sprays and as needed.

## 2018-02-09 ENCOUNTER — Other Ambulatory Visit: Payer: Self-pay | Admitting: Pulmonary Disease

## 2018-03-16 DIAGNOSIS — L814 Other melanin hyperpigmentation: Secondary | ICD-10-CM | POA: Diagnosis not present

## 2018-03-16 DIAGNOSIS — Z85820 Personal history of malignant melanoma of skin: Secondary | ICD-10-CM | POA: Diagnosis not present

## 2018-03-16 DIAGNOSIS — Z85828 Personal history of other malignant neoplasm of skin: Secondary | ICD-10-CM | POA: Diagnosis not present

## 2018-03-16 DIAGNOSIS — D2261 Melanocytic nevi of right upper limb, including shoulder: Secondary | ICD-10-CM | POA: Diagnosis not present

## 2018-05-14 IMAGING — DX DG CHEST 2V
2 series · 2 of 2 positions shown · non-contrast
Comparison: Chest x-ray dated 12/12/2014.

CLINICAL DATA: Routine exam.

EXAM:
CHEST  2 VIEW

[chest pa]
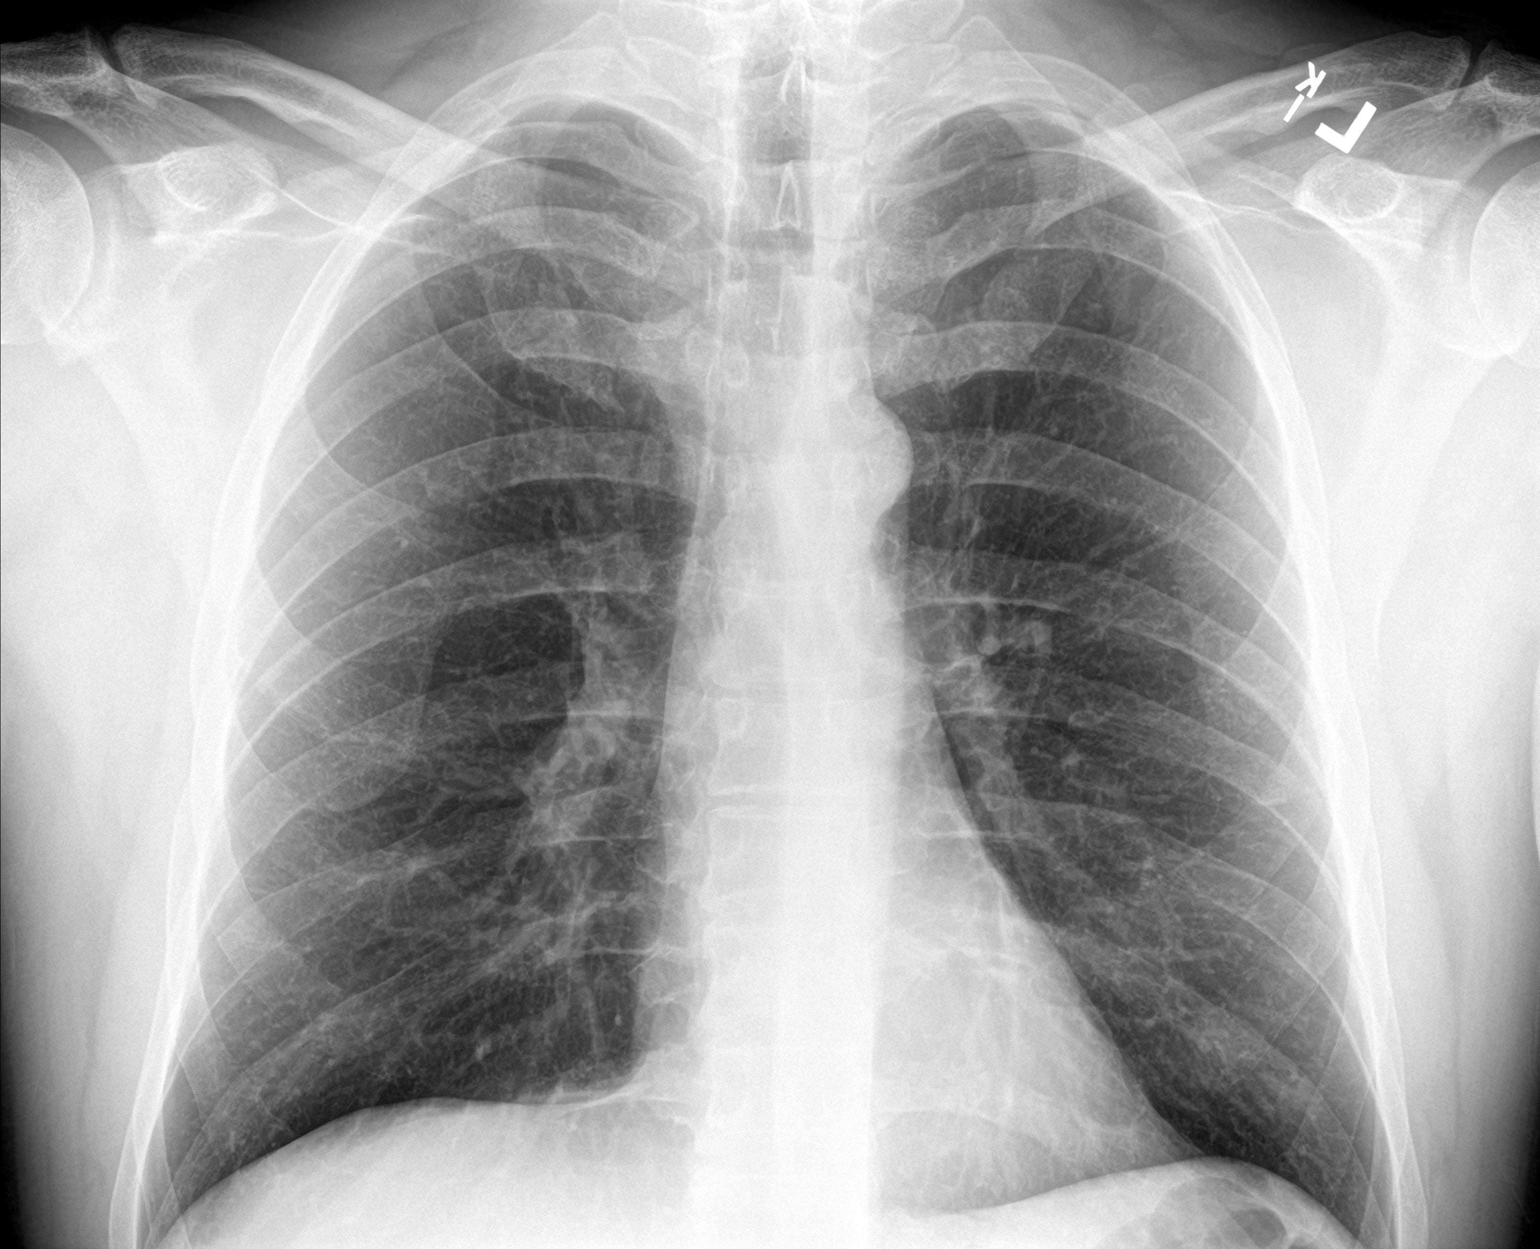

[chest lat]
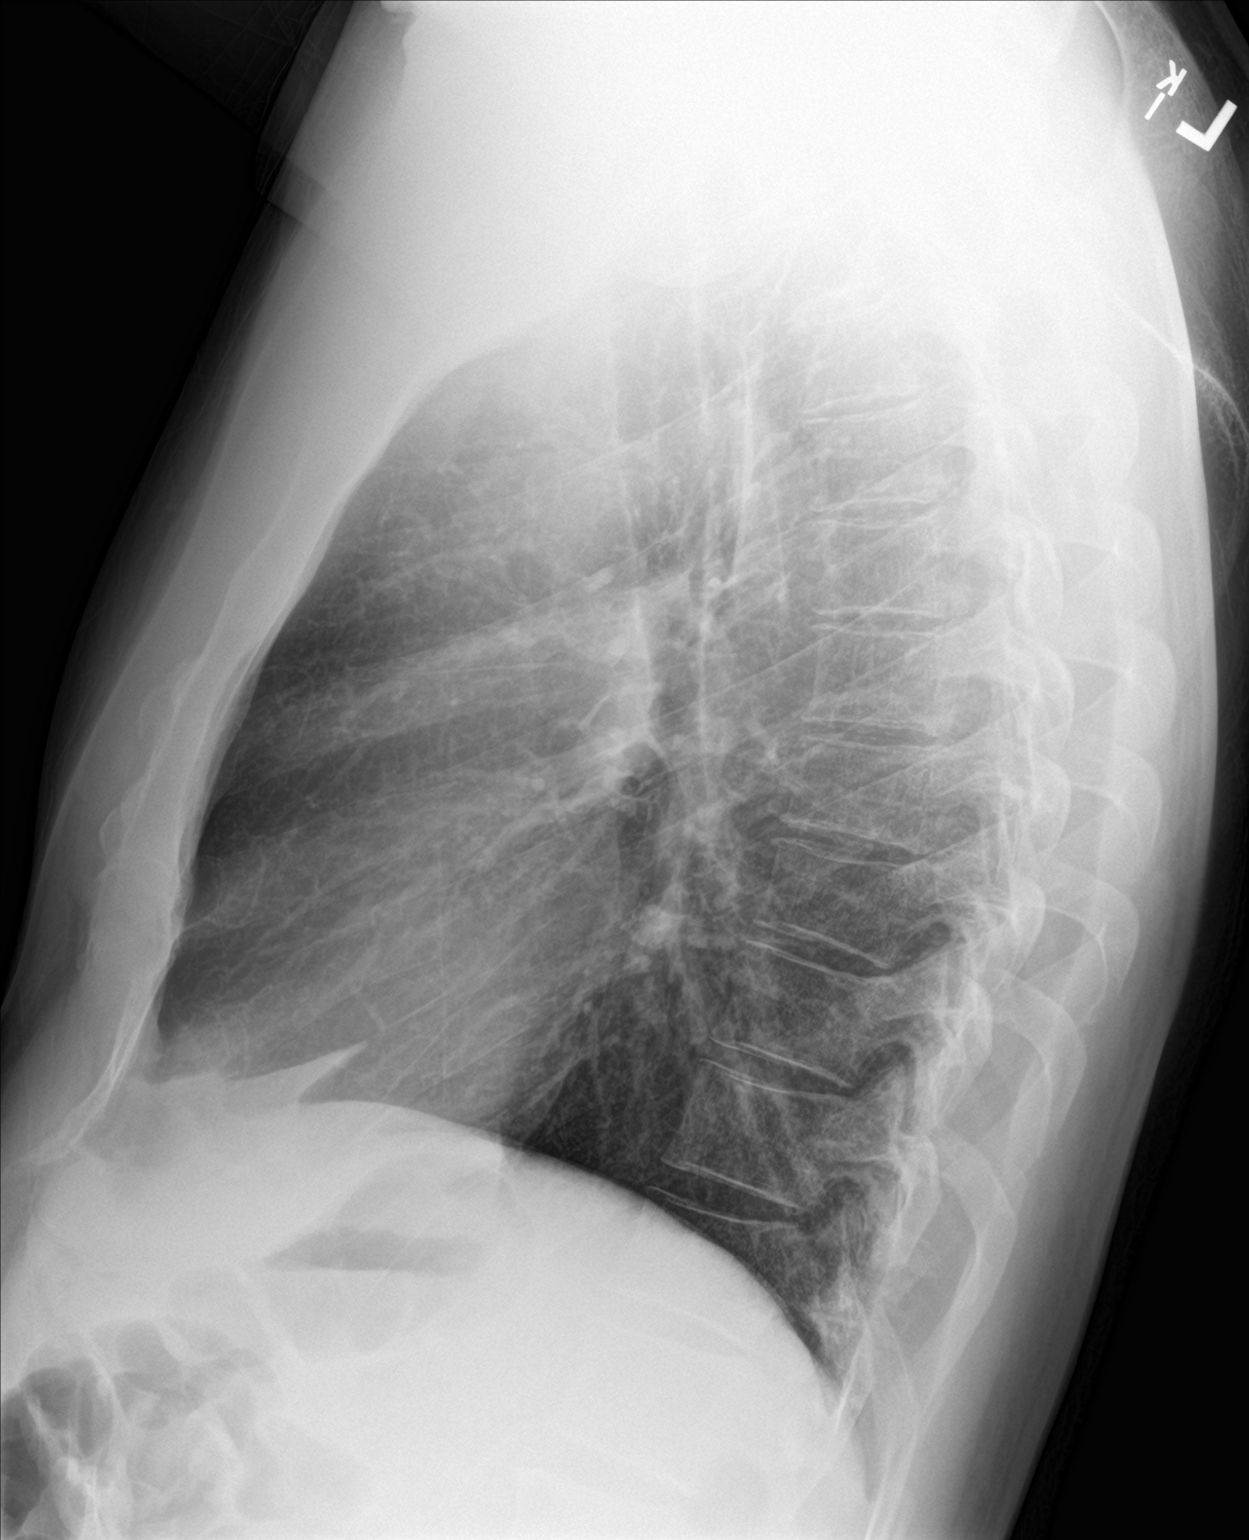

[2 of 2 positions shown; findings below may reference images not displayed]

FINDINGS: The heart size and mediastinal contours are within normal limits.
Both lungs are clear. The visualized skeletal structures are
unremarkable.
IMPRESSION: No active cardiopulmonary disease.

## 2018-06-23 ENCOUNTER — Telehealth: Payer: Self-pay | Admitting: Pulmonary Disease

## 2018-06-23 DIAGNOSIS — Z0000 Encounter for general adult medical examination without abnormal findings: Secondary | ICD-10-CM

## 2018-06-23 NOTE — Addendum Note (Signed)
Addended by: Elton Sin on: 06/23/2018 02:17 PM   Modules accepted: Orders

## 2018-06-23 NOTE — Telephone Encounter (Signed)
SN please advise patient is needing to know if he should come in for lab work prior to appointment. Thank you.

## 2018-06-23 NOTE — Telephone Encounter (Signed)
Per SN-Order CXR, FLP, CBC,TSH,and TSH.  Orders placed.  Notified Patient.  Nothing further at this time.

## 2018-06-28 ENCOUNTER — Other Ambulatory Visit (INDEPENDENT_AMBULATORY_CARE_PROVIDER_SITE_OTHER): Payer: BLUE CROSS/BLUE SHIELD

## 2018-06-28 ENCOUNTER — Ambulatory Visit: Payer: BLUE CROSS/BLUE SHIELD | Admitting: Pulmonary Disease

## 2018-06-28 ENCOUNTER — Ambulatory Visit (INDEPENDENT_AMBULATORY_CARE_PROVIDER_SITE_OTHER): Payer: BLUE CROSS/BLUE SHIELD

## 2018-06-28 ENCOUNTER — Ambulatory Visit (INDEPENDENT_AMBULATORY_CARE_PROVIDER_SITE_OTHER)
Admission: RE | Admit: 2018-06-28 | Discharge: 2018-06-28 | Disposition: A | Discharge status: Home / Self Care | Payer: BLUE CROSS/BLUE SHIELD | Source: Ambulatory Visit | Attending: Pulmonary Disease | Admitting: Pulmonary Disease

## 2018-06-28 ENCOUNTER — Encounter: Payer: Self-pay | Admitting: Pulmonary Disease

## 2018-06-28 VITALS — BP 128/68 | HR 75 | Temp 97.9°F | Ht 75.5 in | Wt 237.4 lb

## 2018-06-28 DIAGNOSIS — Z0000 Encounter for general adult medical examination without abnormal findings: Secondary | ICD-10-CM

## 2018-06-28 DIAGNOSIS — K200 Eosinophilic esophagitis: Secondary | ICD-10-CM

## 2018-06-28 DIAGNOSIS — Z23 Encounter for immunization: Secondary | ICD-10-CM | POA: Diagnosis not present

## 2018-06-28 DIAGNOSIS — J302 Other seasonal allergic rhinitis: Secondary | ICD-10-CM

## 2018-06-28 DIAGNOSIS — G4739 Other sleep apnea: Secondary | ICD-10-CM | POA: Diagnosis not present

## 2018-06-28 LAB — COMPREHENSIVE METABOLIC PANEL
ALT: 26 U/L (ref 0–53)
AST: 25 U/L (ref 0–37)
Albumin: 4.2 g/dL (ref 3.5–5.2)
Alkaline Phosphatase: 81 U/L (ref 39–117)
BILIRUBIN TOTAL: 1.3 mg/dL — AB (ref 0.2–1.2)
BUN: 15 mg/dL (ref 6–23)
CO2: 29 meq/L (ref 19–32)
Calcium: 9.2 mg/dL (ref 8.4–10.5)
Chloride: 106 mEq/L (ref 96–112)
Creatinine, Ser: 1.24 mg/dL (ref 0.40–1.50)
GFR: 66.05 mL/min (ref >60.00)
GLUCOSE: 90 mg/dL (ref 70–99)
POTASSIUM: 4.1 meq/L (ref 3.5–5.1)
Sodium: 141 mEq/L (ref 135–145)
Total Protein: 6.8 g/dL (ref 6.0–8.3)

## 2018-06-28 LAB — CBC WITH DIFFERENTIAL/PLATELET
BASOS ABS: 0 10*3/uL (ref 0.0–0.1)
BASOS PCT: 0.4 % (ref 0.0–3.0)
EOS PCT: 6.8 % — AB (ref 0.0–5.0)
Eosinophils Absolute: 0.4 10*3/uL (ref 0.0–0.7)
HCT: 43.4 % (ref 39.0–52.0)
Hemoglobin: 15.1 g/dL (ref 13.0–17.0)
LYMPHS ABS: 1.7 10*3/uL (ref 0.7–4.0)
Lymphocytes Relative: 28.6 % (ref 12.0–46.0)
MCHC: 34.7 g/dL (ref 30.0–36.0)
MCV: 90.5 fl (ref 78.0–100.0)
MONO ABS: 0.5 10*3/uL (ref 0.1–1.0)
Monocytes Relative: 8.3 % (ref 3.0–12.0)
NEUTROS ABS: 3.3 10*3/uL (ref 1.4–7.7)
NEUTROS PCT: 55.9 % (ref 43.0–77.0)
PLATELETS: 240 10*3/uL (ref 150.0–400.0)
RBC: 4.8 Mil/uL (ref 4.22–5.81)
RDW: 13.1 % (ref 11.5–15.5)
WBC: 5.8 10*3/uL (ref 4.0–10.5)

## 2018-06-28 LAB — PSA: PSA: 0.34 ng/mL (ref 0.10–4.00)

## 2018-06-28 LAB — TSH: TSH: 1.01 u[IU]/mL (ref 0.35–4.50)

## 2018-06-28 LAB — LIPID PANEL
CHOL/HDL RATIO: 3
Cholesterol: 155 mg/dL (ref 0–200)
HDL: 50.6 mg/dL (ref >39.00)
LDL Cholesterol: 88 mg/dL (ref 0–99)
NONHDL: 104.48
Triglycerides: 83 mg/dL (ref 0.0–149.0)
VLDL: 16.6 mg/dL (ref 0.0–40.0)

## 2018-06-28 MED ORDER — SILDENAFIL CITRATE 20 MG PO TABS: ORAL_TABLET | ORAL | 0 refills | Status: DC

## 2018-06-28 NOTE — Progress Notes (Signed)
Subjective:    Patient ID: Thomas Koch, male    DOB: 1971/01/29, 47 y.o.   MRN: [id number]  HPI 47 y/o WM here for a follow up visit and CPX... he is the son of [name] & [name], he is essentially a non-smoker, and enjoys excellent general medical health... He is a Armed forces operational officer at [employer]...  ~  June 05, 2011:  2.5 yr ROV & CPX> he reports feeling well & has no new complaints or concerns; on no regular meds now but relates an interesting hx over the last year w/ unknown/ undiagnosed rheumatologic syndrome; presented 8/11 w/ right leg swelling and pain in his leg, knee, foot, & soft tissues; eval by Rheum DrBeekman ?etiology ?inflamm, ?infectious; Hosp by [facility] w/ right thigh pain> ?fasciitis, polyarthritis, raynauds; treated w/ Clinda, Pred, Benedryl;  Etiology remains obscure but the pt wonders about coffee & meds he was taking "I was very sensitive to caffeine" & all symptoms finally resolved off all meds & off all caffeine...  Now he works out regularly in gym, runs, etc> feeling well w/o residual effects...    CPX today w/ norm vital signs, neg exam, & normal CXR/ EKG/ Labs...  ~  Mar 02, 2013:  43moROV & CPX> THannahdescribes a good interval w/o new complaints or concerns... He denies recurrent orthopedic symptoms- no back pain, shoulder discomfort, leg pain, etc... He tells me that he had a MELANOMA on his back w/ wide excision by [name] (Bx first, then wide excision)- he reports doing well & follows up w/ Derm Q37moe says...     We reviewed prob list, meds, xrays and labs> see below for updates >>  LABS 5/14:  FLP- at goals on diet alone;  Chems- wnl;  CBC- wnl;  TSH- 0.82;  UA- wnl/clear...   ~  December 12, 2014:  2171moV & CPX> TerHeltonports a good interval- feeling well w/o new complaints or concerns; he notes occas pain in his back w/ wt lifting and asked to take it easy & discuss w/ personal trainer (he uses musc relaxer & heat for relief);  His ROS is essentially neg- notes  occas reflux symptoms & we decided to treat w/ Protonix40;  He had a melanoma removed from his back & he continues to check w/ Derm Q6mo83mo  We reviewed prob list, meds, xrays and labs> see below for updates >>   CXR 3/16 showed norm heart size, clear lungs, NAD...  Marland KitchenMarland KitchenG 3/16 showed NSR, rate72, rsr' otherw neg/ NAD...  LABS 3/16:  FLP- wnl on diet;  Chems- wnl;  CBC- wnl;  TSH=0.94...  ~  June 25, 2017:  2.5 year ROV & CPX>  [place] for a f/u CPX- prev worked at [employer] w/ his swallowing- noting dysphagia & food gets stuck in esopSardisoccas & he has vomited up undigested food previously;  He also tells me that his signif other complains about hs snoring & he is willing to pursue the diagnosis w/ additional testing at this time...     He saw ENT-[name] 06/19/17> c/o dysphagia & globus sensation, hx pos for GERD/ food sticking in lower esoph/ & some vomiting; ENT exam revealed a deviated septum, tonsillar hypertrophy & tongue base hypertrophy;  REC Protonix40 before dinner, aggressive antireflux regimen... We reviewed the following medical problems during today's office visit >>     R/O OSA>  Pt says his signif other c/o his  snoring & restlessness; he denies daytime hypersomnolence, trouble w/ driving, etc; he has agreed to proceed w/ PSG...    Overweight>  He has weighed as much at 245#, currently 238#, 76"Tall, BMI=29...    GI- DYSPHAGIA>  As noted above- his CC is solid food dysphagia, he needs GI consult for EGD and dilatation...    GU> hx vasectomy age 28 and varicocele on left evaluated by [name] in 2013; he requests refill of Sildenafil for ED...    Hx rotator cuff repair & LBP in the past...    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    Hx MELANOMA removed from the left side of his back w/ wide excision by [name] in 2014, they still check him Q84moEXAM shows Afeb, VSS, O2sat=98% on RA;  HEENT- neg, mallampati2;  Chest- clear w/o w/r/r;  Heart- RR w/o m/r/g;  Abd- soft, nontender, neg;  Ext- neg w/o c/c/e;  Neuro- wnl, no focal abn...  CXR ordered 06/25/17> [facility]...  EKG done 06/25/17>  NSR, rate 65, rsr' in V1 otherw WNL, NAD...  LABS done 06/25/17>  FLP- all parameters are wnl on diet alone;  Chems- wnl;  CBC- wnl;  TSH=0.93...  Sleep Study> ordered & pending... IMP/PLAN>>  We reviewed his dysphagia & the need for GI consult/ EGD/ dilatation, we will set this up; in the meanwhile take the Protonix40 ~389m before dinner, NPO after dinner, elev HOB 6" for a vigorous antireflux regimen;  We will arrange for a PSG to eval his symptoms of OSA;  We reviewed diet/ exercise/ wt reduction strategies...  ADDENDUM>> Pt ret for CXR 07/18/17> norm heart size, clear lungs- NAD...    ~  June 28, 2018:  Yearly ROV & CPX>               Problem List:    R/O OSA>>  Presented 06/2017 c/o SO complaints of snoring etc => he is willing to undergo Sleep Study for further eval...  OVERWEIGHT >> ~  Weight 5/14 = 241#, he is 76" tall, BMI= 29-30... ~  Weight 3/16 = 245#  Note: FLP, Liver panel, etc are all WNL... ~  Weight 9/18 = 238#  GI>  He presented 06/2017 w/ some intermittent solid food dysphagia w/ occas vomiting up undigested food=> we started Protonix40 ~3051mbefore dinner, NPO after dinner, elev HOB 6" and we will refer to GI for EGD & dilatation.  GU>  Hx Vasectomy age 74,105[facility]ico[name]; on Sildenafil prn for ED...  Hx of right thigh pain/ swelling/ inflamm ?etiology 8/11> ?fasciitis treated w/ Antibiotics, Pred, Benedryl, etc but pt feeling this may have been caused by caffeine & finally  resolved w/o residual effects off all meds & off all caffeine...  ROTATOR CUFF REPAIR, HX OF (ICD-V45.89) Hx of BACK PAIN, LUMBAR (ICD-724.2) - on ROBAXIN 500m68md prn & TRAMADOL 50mg52m prn; neg lumbar spine films in 2008...  Hx Malignant Melanoma >> ~  He reports MM removed from the left side of his back by [name]> 1st w/ Bx, then w/ wide excision, & he continues regular f/u w/ derm (we do not have any notes from  them)... ~  3/16: he continues to f/u w/ Derm Q36mo(we do not have notes from them)...    Past Medical History:  Diagnosis Date  . Lumbar back pain   . Physical exam, annual     Past Surgical History:  Procedure Laterality Date  . melanona removed  08/2012   on his upper back    Outpatient Encounter Medications as of 06/28/2018  Medication Sig  . levocetirizine (XYZAL) 5 MG tablet Take 1 tablet (5 mg total) by mouth every evening.  . sildenafil (REVATIO) 20 MG tablet Take 1-5 tabs as directed  . [DISCONTINUED] fluticasone (FLONASE) 50 MCG/ACT nasal spray Place 1 spray into both nostrils 2 (two) times daily as needed for allergies or rhinitis.  . [DISCONTINUED] Olopatadine HCl (PAZEO) 0.7 % SOLN Place 1 drop into both eyes 1 day or 1 dose.  . [DISCONTINUED] sildenafil (REVATIO) 20 MG tablet TAKE 1 TO 5 TABLETS BY MOUTH AS NEEDED   Facility-Administered Encounter Medications as of 06/28/2018  Medication  . 0.9 %  sodium chloride infusion    Allergies  Allergen Reactions  . Caffeine     REACTION: causes hives, itching Pt states he can tolerate small amounts of caffeine 12/12/2014  . Ibuprofen     REACTION: hives  . Penicillins     REACTION: rash    Immunization History  Administered Date(s) Administered  . Influenza Split 07/13/2014  . Influenza,inj,Quad PF,6+ Mos 06/25/2016, 06/28/2018    Current Medications, Allergies, Past Medical History, Past Surgical History, Family History, and Social History were reviewed in [employer] record.   Review of Systems    The patient denies fever, chills, sweats, anorexia, fatigue, weakness, malaise, weight loss, sleep disorder, blurring, diplopia, eye irritation, eye discharge, vision loss, eye pain, photophobia, earache, ear discharge, tinnitus, decreased hearing, nasal congestion, nosebleeds, sore throat, hoarseness, chest pain, palpitations, syncope, dyspnea on exertion, orthopnea, PND, peripheral edema, cough, dyspnea at rest, excessive sputum, hemoptysis, wheezing, pleurisy, nausea, vomiting, diarrhea, constipation, change in bowel habits, abdominal pain, melena, hematochezia, jaundice, gas/bloating, indigestion/heartburn, dysphagia, odynophagia, dysuria, hematuria, urinary frequency, urinary hesitancy, nocturia, incontinence, back pain, joint pain, joint swelling, muscle cramps, muscle weakness, stiffness, arthritis, sciatica, restless legs, leg pain at night, leg pain with exertion, rash, itching, dryness, suspicious lesions, paralysis, paresthesias, seizures, tremors, vertigo, transient blindness, frequent falls, frequent headaches, difficulty walking, depression, anxiety, memory loss, confusion, cold intolerance, heat intolerance, polydipsia, polyphagia, polyuria, unusual weight change, abnormal bruising, bleeding, enlarged lymph nodes, urticaria, allergic rash, hay fever, and recurrent infections.    Objective:   Physical Exam     WD, WN, 47y/o WM in NAD... GENERAL:  Alert & oriented; pleasant & cooperative. HEENT:  [place]/AT, EOM-wnl, PERRLA, Fundi-benign, EACs-clear, TMs-wnl, NOSE-clear, THROAT-clear & wnl. NECK:  Supple w/ full ROM; no JVD; normal carotid impulses w/o bruits; no thyromegaly or nodules palpated; no lymphadenopathy. CHEST:  Clear to P & A; without wheezes/ rales/ or rhonchi. BACK:  Scar toward left side of upper back- from melanoma removal by [name]... HEART:  Regular Rhythm; without murmurs/ rubs/ or gallops. ABDOMEN:  Soft & nontender; normal bowel  sounds; no organomegaly or masses detected. RECTAL:  Neg - prostate 2+ & nontender w/o nodules; stool hematest neg. EXT: without deformities or arthritic changes; no varicose veins/ venous insuffic/ or edema. NEURO:  CN's intact; motor testing normal; sensory testing normal; gait normal & balance OK. DERM:  No lesions noted; no rash etc...  RADIOLOGY DATA:  Reviewed in the EPIC  EMR & discussed w/ the patient...  LABORATORY DATA:  Reviewed in the EPIC EMR & discussed w/ the patient...   Assessment & Plan:    CPX>      R/O OSA w/ sleep study ordered...    We will proceed w/ GI consult for EGD & dilatation, start Protonix & antireflux regimen;  Overweight>  We reviewed diet, exercise, wt reduction strategies...  ORTHO>  Hx LBP, Rotator cuff tear, prev right thigh pain; he denies current issues, doing satis.....  Hx MELANOMA>  He reports that this was removed from the left side of his back in 2013 by [name] & they continue to follow (we do not have records)...   Patient's Medications  New Prescriptions   No medications on file  Previous Medications   LEVOCETIRIZINE (XYZAL) 5 MG TABLET    Take 1 tablet (5 mg total) by mouth every evening.  Modified Medications   Modified Medication Previous Medication   SILDENAFIL (REVATIO) 20 MG TABLET sildenafil (REVATIO) 20 MG tablet      Take 1-5 tabs as directed    TAKE 1 TO 5 TABLETS BY MOUTH AS NEEDED  Discontinued Medications   FLUTICASONE (FLONASE) 50 MCG/ACT NASAL SPRAY    Place 1 spray into both nostrils 2 (two) times daily as needed for allergies or rhinitis.   OLOPATADINE HCL (PAZEO) 0.7 % SOLN    Place 1 drop into both eyes 1 day or 1 dose.

## 2018-06-28 NOTE — Patient Instructions (Signed)
Today we updated your med list in our EPIC system...    Continue your current medications the same...  We refilled your Sildenafil per request...  We gave you the 2019 FLU vaccine today...  Today we did your follow up CXR & FASTING blood work...    We will contact you w/ the results when available...   Keep up the good work w/ diet & exercise...  Call for any questions or if we can be of service in any way...  Thomas Koch, it has been my honor to have been your doctor over these past few yrs...    My best wishes for a healthy & happy future for you and your family...       We discussed contacting [facility] early in 2020 to get established.Marland KitchenMarland Kitchen

## 2018-06-29 ENCOUNTER — Telehealth: Payer: Self-pay | Admitting: Pulmonary Disease

## 2018-06-29 NOTE — Telephone Encounter (Signed)
Pt is returning call. Cb is [phone].

## 2018-06-29 NOTE — Telephone Encounter (Signed)
Called and spoke with patient, made aware of results per MD Lenna Gilford. Voiced understanding. Requests labs be mailed to him. Labs printed and placed upfront to be mailed. Nothing further needed at this time.

## 2018-06-29 NOTE — Telephone Encounter (Signed)
Thomas Space, MD  Elton Sin, LPN        Please notify patient> LABS look good  FLP- all parameters at at goals on diet alone- keep up the good work...  Chems, CBC, thyroid are all WNL...    ATC pt- unable to leave vm, as mailbox has not been setup. Will call back

## 2018-07-19 ENCOUNTER — Telehealth: Payer: Self-pay | Admitting: Pulmonary Disease

## 2018-07-19 MED ORDER — SILDENAFIL CITRATE 20 MG PO TABS: ORAL_TABLET | ORAL | 11 refills | Status: DC

## 2018-07-19 NOTE — Telephone Encounter (Signed)
Spoke with pt. He is requesting refills on Sildenafil and Pantoprazole. Pt is requesting for 11 refills be placed on both prescriptions. Sildenafil has been sent in. Pt states that he has not been taking Pantoprazole the way he should and would like to start back on it.  SN - please advise on refill. Thanks.

## 2018-07-19 NOTE — Telephone Encounter (Signed)
Attempted to contact pt. I did not receive an answer and his voicemail was not set up. Will try back.

## 2018-07-19 NOTE — Telephone Encounter (Signed)
Pt returning call. Pt contact niumber [phone]

## 2018-07-20 MED ORDER — PANTOPRAZOLE SODIUM 40 MG PO TBEC: 40.0000 mg | DELAYED_RELEASE_TABLET | Freq: Every day | ORAL | 3 refills | Status: DC

## 2018-07-20 NOTE — Telephone Encounter (Signed)
Per SN- ok for Pantoprazole 86m, #90, take 1 daily, 3 refills. Sildenafil 1083m #30, take 1/2 -1 tab by mouth, as needed, 11 refills. Sildenafil 10065ment to pharmacy per LinRia Comment/8/19 Patient notified of prescriptions, requested HarWellingtonPatient aware of goodrx coupons.  Nothing further at this time.

## 2018-09-14 DIAGNOSIS — D2262 Melanocytic nevi of left upper limb, including shoulder: Secondary | ICD-10-CM | POA: Diagnosis not present

## 2018-09-14 DIAGNOSIS — Z85828 Personal history of other malignant neoplasm of skin: Secondary | ICD-10-CM | POA: Diagnosis not present

## 2018-09-14 DIAGNOSIS — D225 Melanocytic nevi of trunk: Secondary | ICD-10-CM | POA: Diagnosis not present

## 2018-09-14 DIAGNOSIS — L814 Other melanin hyperpigmentation: Secondary | ICD-10-CM | POA: Diagnosis not present

## 2018-09-14 DIAGNOSIS — Z85820 Personal history of malignant melanoma of skin: Secondary | ICD-10-CM | POA: Diagnosis not present

## 2019-03-23 DIAGNOSIS — B078 Other viral warts: Secondary | ICD-10-CM | POA: Diagnosis not present

## 2019-03-23 DIAGNOSIS — Z85820 Personal history of malignant melanoma of skin: Secondary | ICD-10-CM | POA: Diagnosis not present

## 2019-03-23 DIAGNOSIS — D2262 Melanocytic nevi of left upper limb, including shoulder: Secondary | ICD-10-CM | POA: Diagnosis not present

## 2019-03-23 DIAGNOSIS — Z85828 Personal history of other malignant neoplasm of skin: Secondary | ICD-10-CM | POA: Diagnosis not present

## 2019-03-23 DIAGNOSIS — D2271 Melanocytic nevi of right lower limb, including hip: Secondary | ICD-10-CM | POA: Diagnosis not present

## 2019-07-25 DIAGNOSIS — M546 Pain in thoracic spine: Secondary | ICD-10-CM | POA: Diagnosis not present

## 2019-07-25 DIAGNOSIS — M545 Low back pain: Secondary | ICD-10-CM | POA: Diagnosis not present

## 2019-08-16 ENCOUNTER — Ambulatory Visit: Payer: BC Managed Care – PPO | Admitting: Family Medicine

## 2019-08-16 ENCOUNTER — Other Ambulatory Visit: Payer: Self-pay

## 2019-08-16 ENCOUNTER — Encounter: Payer: Self-pay | Admitting: Family Medicine

## 2019-08-16 VITALS — BP 110/74 | HR 80 | Temp 98.1°F | Ht 75.5 in | Wt 240.2 lb

## 2019-08-16 DIAGNOSIS — Z1211 Encounter for screening for malignant neoplasm of colon: Secondary | ICD-10-CM | POA: Diagnosis not present

## 2019-08-16 DIAGNOSIS — K200 Eosinophilic esophagitis: Secondary | ICD-10-CM | POA: Diagnosis not present

## 2019-08-16 DIAGNOSIS — M549 Dorsalgia, unspecified: Secondary | ICD-10-CM

## 2019-08-16 DIAGNOSIS — N529 Male erectile dysfunction, unspecified: Secondary | ICD-10-CM

## 2019-08-16 DIAGNOSIS — Z13220 Encounter for screening for lipoid disorders: Secondary | ICD-10-CM | POA: Diagnosis not present

## 2019-08-16 DIAGNOSIS — Z85820 Personal history of malignant melanoma of skin: Secondary | ICD-10-CM

## 2019-08-16 DIAGNOSIS — Z23 Encounter for immunization: Secondary | ICD-10-CM

## 2019-08-16 DIAGNOSIS — Z0001 Encounter for general adult medical examination with abnormal findings: Secondary | ICD-10-CM | POA: Diagnosis not present

## 2019-08-16 DIAGNOSIS — Z125 Encounter for screening for malignant neoplasm of prostate: Secondary | ICD-10-CM

## 2019-08-16 DIAGNOSIS — G8929 Other chronic pain: Secondary | ICD-10-CM

## 2019-08-16 LAB — COMPREHENSIVE METABOLIC PANEL
ALT: 21 U/L (ref 0–53)
AST: 16 U/L (ref 0–37)
Albumin: 4.2 g/dL (ref 3.5–5.2)
Alkaline Phosphatase: 84 U/L (ref 39–117)
BUN: 13 mg/dL (ref 6–23)
CO2: 27 mEq/L (ref 19–32)
Calcium: 8.8 mg/dL (ref 8.4–10.5)
Chloride: 105 mEq/L (ref 96–112)
Creatinine, Ser: 1.01 mg/dL (ref 0.40–1.50)
GFR: 78.37 mL/min (ref >60.00)
Glucose, Bld: 97 mg/dL (ref 70–99)
Potassium: 4.1 mEq/L (ref 3.5–5.1)
Sodium: 140 mEq/L (ref 135–145)
Total Bilirubin: 1.1 mg/dL (ref 0.2–1.2)
Total Protein: 6.8 g/dL (ref 6.0–8.3)

## 2019-08-16 LAB — CBC
HCT: 43 % (ref 39.0–52.0)
Hemoglobin: 14.7 g/dL (ref 13.0–17.0)
MCHC: 34.1 g/dL (ref 30.0–36.0)
MCV: 91.8 fl (ref 78.0–100.0)
Platelets: 235 10*3/uL (ref 150.0–400.0)
RBC: 4.69 Mil/uL (ref 4.22–5.81)
RDW: 13 % (ref 11.5–15.5)
WBC: 7.3 10*3/uL (ref 4.0–10.5)

## 2019-08-16 LAB — LIPID PANEL
Cholesterol: 164 mg/dL (ref 0–200)
HDL: 56.9 mg/dL (ref >39.00)
LDL Cholesterol: 91 mg/dL (ref 0–99)
NonHDL: 107.23
Total CHOL/HDL Ratio: 3
Triglycerides: 79 mg/dL (ref 0.0–149.0)
VLDL: 15.8 mg/dL (ref 0.0–40.0)

## 2019-08-16 LAB — TSH: TSH: 0.76 u[IU]/mL (ref 0.35–4.50)

## 2019-08-16 LAB — PSA: PSA: 0.27 ng/mL (ref 0.10–4.00)

## 2019-08-16 MED ORDER — PANTOPRAZOLE SODIUM 40 MG PO TBEC: 40.0000 mg | DELAYED_RELEASE_TABLET | Freq: Every day | ORAL | 3 refills | Status: DC

## 2019-08-16 MED ORDER — SILDENAFIL CITRATE 20 MG PO TABS: ORAL_TABLET | ORAL | 11 refills | Status: DC

## 2019-08-16 NOTE — Patient Instructions (Signed)
It was very nice to see you today!  We will refill your medications today.  We will check blood work and set you up to have cologuard.  Come back in 1 year for your next physical, or sooner if needed.   Take care, Dr Jerline Pain  Please try these tips to maintain a healthy lifestyle:   Eat at least 3 REAL meals and 1-2 snacks per day.  Aim for no more than 5 hours between eating.  If you eat breakfast, please do so within one hour of getting up.    Obtain twice as many fruits/vegetables as protein or carbohydrate foods for both lunch and dinner. (Half of each meal should be fruits/vegetables, one quarter protein, and one quarter starchy carbs)   Cut down on sweet beverages. This includes juice, soda, and sweet tea.    Exercise at least 150 minutes every week.   Preventive Care 24-104 Years Old, Male Preventive care refers to lifestyle choices and visits with your health care provider that can promote health and wellness. This includes: A yearly physical exam. This is also called an annual well check. Regular dental and eye exams. Immunizations. Screening for certain conditions. Healthy lifestyle choices, such as eating a healthy diet, getting regular exercise, not using drugs or products that contain nicotine and tobacco, and limiting alcohol use. What can I expect for my preventive care visit? Physical exam Your health care provider will check: Height and weight. These may be used to calculate body mass index (BMI), which is a measurement that tells if you are at a healthy weight. Heart rate and blood pressure. Your skin for abnormal spots. Counseling Your health care provider may ask you questions about: Alcohol, tobacco, and drug use. Emotional well-being. Home and relationship well-being. Sexual activity. Eating habits. Work and work Statistician. What immunizations do I need?  Influenza (flu) vaccine This is recommended every year. Tetanus, diphtheria, and pertussis  (Tdap) vaccine You may need a Td booster every 10 years. Varicella (chickenpox) vaccine You may need this vaccine if you have not already been vaccinated. Zoster (shingles) vaccine You may need this after age 68. Measles, mumps, and rubella (MMR) vaccine You may need at least one dose of MMR if you were born in 1957 or later. You may also need a second dose. Pneumococcal conjugate (PCV13) vaccine You may need this if you have certain conditions and were not previously vaccinated. Pneumococcal polysaccharide (PPSV23) vaccine You may need one or two doses if you smoke cigarettes or if you have certain conditions. Meningococcal conjugate (MenACWY) vaccine You may need this if you have certain conditions. Hepatitis A vaccine You may need this if you have certain conditions or if you travel or work in places where you may be exposed to hepatitis A. Hepatitis B vaccine You may need this if you have certain conditions or if you travel or work in places where you may be exposed to hepatitis B. Haemophilus influenzae type b (Hib) vaccine You may need this if you have certain risk factors. Human papillomavirus (HPV) vaccine If recommended by your health care provider, you may need three doses over 6 months. You may receive vaccines as individual doses or as more than one vaccine together in one shot (combination vaccines). Talk with your health care provider about the risks and benefits of combination vaccines. What tests do I need? Blood tests Lipid and cholesterol levels. These may be checked every 5 years, or more frequently if you are over 77 years old.  Hepatitis C test. Hepatitis B test. Screening Lung cancer screening. You may have this screening every year starting at age 45 if you have a 30-pack-year history of smoking and currently smoke or have quit within the past 15 years. Prostate cancer screening. Recommendations will vary depending on your family history and other risks.  Colorectal cancer screening. All adults should have this screening starting at age 62 and continuing until age 66. Your health care provider may recommend screening at age 73 if you are at increased risk. You will have tests every 1-10 years, depending on your results and the type of screening test. Diabetes screening. This is done by checking your blood sugar (glucose) after you have not eaten for a while (fasting). You may have this done every 1-3 years. Sexually transmitted disease (STD) testing. Follow these instructions at home: Eating and drinking Eat a diet that includes fresh fruits and vegetables, whole grains, lean protein, and low-fat dairy products. Take vitamin and mineral supplements as recommended by your health care provider. Do not drink alcohol if your health care provider tells you not to drink. If you drink alcohol: Limit how much you have to 0-2 drinks a day. Be aware of how much alcohol is in your drink. In the [place] one drink equals one 12 oz bottle of beer (355 mL), one 5 oz glass of wine (148 mL), or one 1 oz glass of hard liquor (44 mL). Lifestyle Take daily care of your teeth and gums. Stay active. Exercise for at least 30 minutes on 5 or more days each week. Do not use any products that contain nicotine or tobacco, such as cigarettes, e-cigarettes, and chewing tobacco. If you need help quitting, ask your health care provider. If you are sexually active, practice safe sex. Use a condom or other form of protection to prevent STIs (sexually transmitted infections). Talk with your health care provider about taking a low-dose aspirin every day starting at age 68. What's next? Go to your health care provider once a year for a well check visit. Ask your health care provider how often you should have your eyes and teeth checked. Stay up to date on all vaccines. This information is not intended to replace advice given to you by your health care provider. Make sure you  discuss any questions you have with your health care provider. Document Released: 10/26/2015 Document Revised: 09/23/2018 Document Reviewed: 09/23/2018 Elsevier Patient Education  2020 [employer].

## 2019-08-16 NOTE — Assessment & Plan Note (Signed)
Continue management per dermatology.

## 2019-08-16 NOTE — Assessment & Plan Note (Signed)
Continue protonix 44m daily.

## 2019-08-16 NOTE — Assessment & Plan Note (Signed)
Continue sildenafil as needed.

## 2019-08-16 NOTE — Progress Notes (Signed)
Please inform patient of the following:  Good news! Blood work is all NORMAL. Would like for him to keep up the good work and we can recheck in a year or so.  Algis Greenhouse. Jerline Pain, MD 08/16/2019 12:54 PM

## 2019-08-16 NOTE — Assessment & Plan Note (Signed)
Continue management per orthopedics.

## 2019-08-16 NOTE — Progress Notes (Signed)
Chief Complaint:  Thomas Koch is a 48 y.o. male who presents today for his annual comprehensive physical exam and to establish care.   Assessment/Plan:  Eosinophilic esophagitis Continue protonix 44m daily.   Erectile dysfunction Continue sildenafil as needed.  Chronic back pain Continue management per orthopedics.  History of melanoma Continue management per dermatology.    Body mass index is 29.63 kg/m. / Overweight BMI Metric Follow Up - 08/16/19 0958      BMI Metric Follow Up-Please document annually   BMI Metric Follow Up  Education provided       Preventative Healthcare: Flu vaccine given today. Check CBC, CMET, TSH, and PSA.  Check Cologuard.  Patient Counseling(The following topics were reviewed and/or handout was given):  -Nutrition: Stressed importance of moderation in sodium/caffeine intake, saturated fat and cholesterol, caloric balance, sufficient intake of fresh fruits, vegetables, and fiber.  -Stressed the importance of regular exercise.   -Substance Abuse: Discussed cessation/primary prevention of tobacco, alcohol, or other drug use; driving or other dangerous activities under the influence; availability of treatment for abuse.   -Injury prevention: Discussed safety belts, safety helmets, smoke detector, smoking near bedding or upholstery.   -Sexuality: Discussed sexually transmitted diseases, partner selection, use of condoms, avoidance of unintended pregnancy and contraceptive alternatives.   -Dental health: Discussed importance of regular tooth brushing, flossing, and dental visits.  -Health maintenance and immunizations reviewed. Please refer to Health maintenance section.  Return to care in 1 year for next preventative visit.     Subjective:  HPI:  He has no acute complaints today.   His stable, chronic medical conditions are outlined below:   # GERD / Eosinophilic Esophagitis - On protonix daily and tolerating well  # Erectile  Dysfunction - Uses sildenafil as needed and tolerating   # Chronic Back Pain - Follows with orthopedics - Uses robaxin as needed  % History of Melanoma - Follows with dermatology  Lifestyle Diet: No specific diets or eating plans.  Exercise: Busy in the yard with his kids.  Depression screen PHQ 2/9 08/16/2019  Decreased Interest 0  Down, Depressed, Hopeless 0  PHQ - 2 Score 0    Health Maintenance Due  Topic Date Due  . HIV Screening  03/12/1985  . TETANUS/TDAP  03/12/1989  . INFLUENZA VACCINE  05/14/2019     ROS: Per HPI, otherwise a complete review of systems was negative.   PMH:  The following were reviewed and entered/updated in epic: Past Medical History:  Diagnosis Date  . Lumbar back pain   . Physical exam, annual    Patient Active Problem List   Diagnosis Date Noted  . Erectile dysfunction 08/16/2019  . Chronic back pain 08/16/2019  . History of melanoma 08/16/2019  . Eosinophilic esophagitis 040/98/1191 . Seasonal allergic rhinitis 01/11/2018  . Allergic conjunctivitis 01/11/2018  . Food allergy 01/11/2018  . Sleep apnea, unspecified 08/13/2017  . Melanoma of skin, site unspecified 03/13/2013   Past Surgical History:  Procedure Laterality Date  . melanona removed  08/2012   on his upper back    Family History  Problem Relation Age of Onset  . COPD Father   . Hyperlipidemia Father   . Hypertension Father   . Osteoporosis Mother   . Colon polyps Mother   . Hyperlipidemia Mother   . Heart attack Maternal Grandfather 65  . Diabetes Paternal Grandmother   . Heart disease Paternal Grandmother   . Heart disease Paternal Grandfather     Medications- reviewed  and updated Current Outpatient Medications  Medication Sig Dispense Refill  . methocarbamol (ROBAXIN) 500 MG tablet Robaxin 500 mg tablet  Take 1 tablet 3 times a day by oral route as needed for 10 days.    . pantoprazole (PROTONIX) 40 MG tablet Take 1 tablet (40 mg total) by mouth daily.  90 tablet 3  . sildenafil (REVATIO) 20 MG tablet Take 1-5 tabs as directed 90 tablet 11   No current facility-administered medications for this visit.     Allergies-reviewed and updated Allergies  Allergen Reactions  . Caffeine     REACTION: causes hives, itching Pt states he can tolerate small amounts of caffeine 12/12/2014  . Ibuprofen     REACTION: hives  . Penicillins     REACTION: rash    Social History   Socioeconomic History  . Marital status: Married    Spouse name: Azekiel Cremer  . Number of children: 1  . Years of education: Not on file  . Highest education level: Not on file  Occupational History  . Occupation: Sales promotion account executive  Social Needs  . Financial resource strain: Not on file  . Food insecurity    Worry: Not on file    Inability: Not on file  . Transportation needs    Medical: Not on file    Non-medical: Not on file  Tobacco Use  . Smoking status: Never Smoker  . Smokeless tobacco: Never Used  Substance and Sexual Activity  . Alcohol use: Yes    Alcohol/week: 0.0 standard drinks    Comment: social use  . Drug use: No  . Sexual activity: Yes    Partners: Female  Lifestyle  . Physical activity    Days per week: Not on file    Minutes per session: Not on file  . Stress: Not on file  Relationships  . Social Herbalist on phone: Not on file    Gets together: Not on file    Attends religious service: Not on file    Active member of club or organization: Not on file    Attends meetings of clubs or organizations: Not on file    Relationship status: Not on file  Other Topics Concern  . Not on file  Social History Narrative  . Not on file        Objective:  Physical Exam: BP 110/74   Pulse 80   Temp 98.1 F (36.7 C)   Ht 6' 3.5" (1.918 m)   Wt 240 lb 3.2 oz (109 kg)   SpO2 99%   BMI 29.63 kg/m   Body mass index is 29.63 kg/m. Wt Readings from Last 3 Encounters:  08/16/19 240 lb 3.2 oz (109 kg)  06/28/18 237 lb 6.4 oz (107.7  kg)  01/11/18 242 lb 3.2 oz (109.9 kg)   Gen: NAD, resting comfortably HEENT: TMs normal bilaterally. OP clear. No thyromegaly noted.  CV: RRR with no murmurs appreciated Pulm: NWOB, CTAB with no crackles, wheezes, or rhonchi GI: Normal bowel sounds present. Soft, Nontender, Nondistended. MSK: no edema, cyanosis, or clubbing noted Skin: warm, dry Neuro: CN2-12 grossly intact. Strength 5/5 in upper and lower extremities. Reflexes symmetric and intact bilaterally.  Psych: Normal affect and thought content     Caleb M. Jerline Pain, MD 08/16/2019 10:00 AM

## 2019-08-18 ENCOUNTER — Telehealth: Payer: Self-pay

## 2019-08-18 NOTE — Telephone Encounter (Signed)
Copied from [place] [id number]. Topic: General - Other >> Aug 18, 2019  3:51 PM Sheran Luz wrote: Patient requesting results from 11/3 be released to [place].

## 2019-08-23 NOTE — Telephone Encounter (Signed)
Labs released,patient notified.

## 2019-08-31 DIAGNOSIS — Z1211 Encounter for screening for malignant neoplasm of colon: Secondary | ICD-10-CM | POA: Diagnosis not present

## 2019-09-07 LAB — COLOGUARD: Cologuard: NEGATIVE

## 2019-09-14 DIAGNOSIS — Z85828 Personal history of other malignant neoplasm of skin: Secondary | ICD-10-CM | POA: Diagnosis not present

## 2019-09-14 DIAGNOSIS — Z85820 Personal history of malignant melanoma of skin: Secondary | ICD-10-CM | POA: Diagnosis not present

## 2019-09-14 DIAGNOSIS — D2262 Melanocytic nevi of left upper limb, including shoulder: Secondary | ICD-10-CM | POA: Diagnosis not present

## 2019-09-14 DIAGNOSIS — B078 Other viral warts: Secondary | ICD-10-CM | POA: Diagnosis not present

## 2019-10-05 ENCOUNTER — Encounter: Payer: Self-pay | Admitting: Family Medicine

## 2019-10-20 DIAGNOSIS — M546 Pain in thoracic spine: Secondary | ICD-10-CM | POA: Diagnosis not present

## 2019-10-25 DIAGNOSIS — M519 Unspecified thoracic, thoracolumbar and lumbosacral intervertebral disc disorder: Secondary | ICD-10-CM | POA: Diagnosis not present

## 2019-10-25 DIAGNOSIS — M546 Pain in thoracic spine: Secondary | ICD-10-CM | POA: Diagnosis not present

## 2019-10-25 DIAGNOSIS — M40204 Unspecified kyphosis, thoracic region: Secondary | ICD-10-CM | POA: Diagnosis not present

## 2019-12-16 DIAGNOSIS — M546 Pain in thoracic spine: Secondary | ICD-10-CM | POA: Diagnosis not present

## 2019-12-16 DIAGNOSIS — M519 Unspecified thoracic, thoracolumbar and lumbosacral intervertebral disc disorder: Secondary | ICD-10-CM | POA: Diagnosis not present

## 2020-03-14 DIAGNOSIS — Z85828 Personal history of other malignant neoplasm of skin: Secondary | ICD-10-CM | POA: Diagnosis not present

## 2020-03-14 DIAGNOSIS — D2261 Melanocytic nevi of right upper limb, including shoulder: Secondary | ICD-10-CM | POA: Diagnosis not present

## 2020-03-14 DIAGNOSIS — D2371 Other benign neoplasm of skin of right lower limb, including hip: Secondary | ICD-10-CM | POA: Diagnosis not present

## 2020-03-14 DIAGNOSIS — Z85820 Personal history of malignant melanoma of skin: Secondary | ICD-10-CM | POA: Diagnosis not present

## 2020-06-26 ENCOUNTER — Encounter: Payer: Self-pay | Admitting: Adult Health

## 2020-06-26 ENCOUNTER — Telehealth: Payer: Self-pay | Admitting: Adult Health

## 2020-06-26 NOTE — Telephone Encounter (Signed)
Spoke with pt, states that Dr. Lenna Gilford was his PCP Xseveral years until he retired.  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  Pt states he has cut back on caffeine and aspartame which has resolved his autoimmune issues.  Pt does not follow with a rheumatologist, states that SN managed his autoimmune issues.  Pt's chart also does not reflect any autoimmune disorders documented.   Pt is concerned that if he receives the Covid vaccine that it could cause an autoimmune flare-up.  Pt is requesting TP's recs since SN is retired.   I advised that since we have not seen the pt in 2 years that we would be limited in what advice we could offer, as we no longer follow his care.  Pt expressed understanding, but still requested TP's recs.    TP please advise.  Thanks!

## 2020-06-27 NOTE — Telephone Encounter (Signed)
Discussion with patient with education on vaccine .

## 2020-07-24 ENCOUNTER — Other Ambulatory Visit: Payer: Self-pay

## 2020-07-24 ENCOUNTER — Encounter: Payer: Self-pay | Admitting: Family Medicine

## 2020-07-24 ENCOUNTER — Ambulatory Visit (INDEPENDENT_AMBULATORY_CARE_PROVIDER_SITE_OTHER): Payer: BC Managed Care – PPO | Admitting: Family Medicine

## 2020-07-24 VITALS — BP 132/80 | HR 79 | Temp 98.4°F | Ht 75.5 in | Wt 244.4 lb

## 2020-07-24 DIAGNOSIS — K200 Eosinophilic esophagitis: Secondary | ICD-10-CM | POA: Diagnosis not present

## 2020-07-24 DIAGNOSIS — N529 Male erectile dysfunction, unspecified: Secondary | ICD-10-CM

## 2020-07-24 DIAGNOSIS — Z13220 Encounter for screening for lipoid disorders: Secondary | ICD-10-CM

## 2020-07-24 DIAGNOSIS — R059 Cough, unspecified: Secondary | ICD-10-CM

## 2020-07-24 DIAGNOSIS — Z0001 Encounter for general adult medical examination with abnormal findings: Secondary | ICD-10-CM | POA: Diagnosis not present

## 2020-07-24 NOTE — Patient Instructions (Signed)
It was very nice to see you today!  We will check blood work today.  I will see back in year. Please continue working on diet and exercise. Please come back to see me sooner if needed.  Take care, Dr Jerline Pain  Please try these tips to maintain a healthy lifestyle:   Eat at least 3 REAL meals and 1-2 snacks per day.  Aim for no more than 5 hours between eating.  If you eat breakfast, please do so within one hour of getting up.    Each meal should contain half fruits/vegetables, one quarter protein, and one quarter carbs (no bigger than a computer mouse)   Cut down on sweet beverages. This includes juice, soda, and sweet tea.     Drink at least 1 glass of water with each meal and aim for at least 8 glasses per day   Exercise at least 150 minutes every week.    Preventive Care 15-93 Years Old, Male Preventive care refers to lifestyle choices and visits with your health care provider that can promote health and wellness. This includes:  A yearly physical exam. This is also called an annual well check.  Regular dental and eye exams.  Immunizations.  Screening for certain conditions.  Healthy lifestyle choices, such as eating a healthy diet, getting regular exercise, not using drugs or products that contain nicotine and tobacco, and limiting alcohol use. What can I expect for my preventive care visit? Physical exam Your health care provider will check:  Height and weight. These may be used to calculate body mass index (BMI), which is a measurement that tells if you are at a healthy weight.  Heart rate and blood pressure.  Your skin for abnormal spots. Counseling Your health care provider may ask you questions about:  Alcohol, tobacco, and drug use.  Emotional well-being.  Home and relationship well-being.  Sexual activity.  Eating habits.  Work and work Statistician. What immunizations do I need?  Influenza (flu) vaccine  This is recommended every  year. Tetanus, diphtheria, and pertussis (Tdap) vaccine  You may need a Td booster every 10 years. Varicella (chickenpox) vaccine  You may need this vaccine if you have not already been vaccinated. Zoster (shingles) vaccine  You may need this after age 9. Measles, mumps, and rubella (MMR) vaccine  You may need at least one dose of MMR if you were born in 1957 or later. You may also need a second dose. Pneumococcal conjugate (PCV13) vaccine  You may need this if you have certain conditions and were not previously vaccinated. Pneumococcal polysaccharide (PPSV23) vaccine  You may need one or two doses if you smoke cigarettes or if you have certain conditions. Meningococcal conjugate (MenACWY) vaccine  You may need this if you have certain conditions. Hepatitis A vaccine  You may need this if you have certain conditions or if you travel or work in places where you may be exposed to hepatitis A. Hepatitis B vaccine  You may need this if you have certain conditions or if you travel or work in places where you may be exposed to hepatitis B. Haemophilus influenzae type b (Hib) vaccine  You may need this if you have certain risk factors. Human papillomavirus (HPV) vaccine  If recommended by your health care provider, you may need three doses over 6 months. You may receive vaccines as individual doses or as more than one vaccine together in one shot (combination vaccines). Talk with your health care provider about the risks  and benefits of combination vaccines. What tests do I need? Blood tests  Lipid and cholesterol levels. These may be checked every 5 years, or more frequently if you are over 78 years old.  Hepatitis C test.  Hepatitis B test. Screening  Lung cancer screening. You may have this screening every year starting at age 53 if you have a 30-pack-year history of smoking and currently smoke or have quit within the past 15 years.  Prostate cancer screening.  Recommendations will vary depending on your family history and other risks.  Colorectal cancer screening. All adults should have this screening starting at age 61 and continuing until age 59. Your health care provider may recommend screening at age 39 if you are at increased risk. You will have tests every 1-10 years, depending on your results and the type of screening test.  Diabetes screening. This is done by checking your blood sugar (glucose) after you have not eaten for a while (fasting). You may have this done every 1-3 years.  Sexually transmitted disease (STD) testing. Follow these instructions at home: Eating and drinking  Eat a diet that includes fresh fruits and vegetables, whole grains, lean protein, and low-fat dairy products.  Take vitamin and mineral supplements as recommended by your health care provider.  Do not drink alcohol if your health care provider tells you not to drink.  If you drink alcohol: ? Limit how much you have to 0-2 drinks a day. ? Be aware of how much alcohol is in your drink. In the [place] one drink equals one 12 oz bottle of beer (355 mL), one 5 oz glass of wine (148 mL), or one 1 oz glass of hard liquor (44 mL). Lifestyle  Take daily care of your teeth and gums.  Stay active. Exercise for at least 30 minutes on 5 or more days each week.  Do not use any products that contain nicotine or tobacco, such as cigarettes, e-cigarettes, and chewing tobacco. If you need help quitting, ask your health care provider.  If you are sexually active, practice safe sex. Use a condom or other form of protection to prevent STIs (sexually transmitted infections).  Talk with your health care provider about taking a low-dose aspirin every day starting at age 52. What's next?  Go to your health care provider once a year for a well check visit.  Ask your health care provider how often you should have your eyes and teeth checked.  Stay up to date on all vaccines. This  information is not intended to replace advice given to you by your health care provider. Make sure you discuss any questions you have with your health care provider. Document Revised: 09/23/2018 Document Reviewed: 09/23/2018 Elsevier Patient Education  2020 [employer].

## 2020-07-24 NOTE — Assessment & Plan Note (Signed)
Stable. Continue sildenafil as needed.

## 2020-07-24 NOTE — Progress Notes (Signed)
Chief Complaint:  Thomas Koch is a 49 y.o. male who presents today for his annual comprehensive physical exam.    Assessment/Plan:  Chronic Problems Addressed Today: Erectile dysfunction Stable. Continue sildenafil as needed.  Eosinophilic esophagitis Stable. Continue Protonix 40 mg daily. Check CBC, CMET, TSH.  Preventative Healthcare: Up-to-date on colon cancer screening. Recommended shingles vaccine -he will check with insurance and pharmacy. Had extensive discussion regarding Covid vaccine and recommended patient get vaccinated. Will check Covid antibody today. Check CBC, CMET, TSH.  Patient Counseling(The following topics were reviewed and/or handout was given):  -Nutrition: Stressed importance of moderation in sodium/caffeine intake, saturated fat and cholesterol, caloric balance, sufficient intake of fresh fruits, vegetables, and fiber.  -Stressed the importance of regular exercise.   -Substance Abuse: Discussed cessation/primary prevention of tobacco, alcohol, or other drug use; driving or other dangerous activities under the influence; availability of treatment for abuse.   -Injury prevention: Discussed safety belts, safety helmets, smoke detector, smoking near bedding or upholstery.   -Sexuality: Discussed sexually transmitted diseases, partner selection, use of condoms, avoidance of unintended pregnancy and contraceptive alternatives.   -Dental health: Discussed importance of regular tooth brushing, flossing, and dental visits.  -Health maintenance and immunizations reviewed. Please refer to Health maintenance section.  Return to care in 1 year for next preventative visit.     Subjective:  HPI:  He has no acute complaints today.   Lifestyle Diet: Balanced. Trying to get plenty of fruits and vegetables.  Exercise: Going to the gym multiple times per week. Cardio and weight training.   Depression screen PHQ 2/9 08/16/2019  Decreased Interest 0  Down, Depressed,  Hopeless 0  PHQ - 2 Score 0    Health Maintenance Due  Topic Date Due  . Hepatitis C Screening  Never done  . HIV Screening  Never done     ROS: Per HPI, otherwise a complete review of systems was negative.   PMH:  The following were reviewed and entered/updated in epic: Past Medical History:  Diagnosis Date  . Lumbar back pain   . Physical exam, annual    Patient Active Problem List   Diagnosis Date Noted  . Erectile dysfunction 08/16/2019  . Chronic back pain 08/16/2019  . History of melanoma 08/16/2019  . Eosinophilic esophagitis 03/00/9233  . Seasonal allergic rhinitis 01/11/2018  . Allergic conjunctivitis 01/11/2018  . Food allergy 01/11/2018  . Sleep apnea, unspecified 08/13/2017  . Melanoma of skin, site unspecified 03/13/2013   Past Surgical History:  Procedure Laterality Date  . melanona removed  08/2012   on his upper back    Family History  Problem Relation Age of Onset  . COPD Father   . Hyperlipidemia Father   . Hypertension Father   . Osteoporosis Mother   . Colon polyps Mother   . Hyperlipidemia Mother   . Heart attack Maternal Grandfather 65  . Diabetes Paternal Grandmother   . Heart disease Paternal Grandmother   . Heart disease Paternal Grandfather     Medications- reviewed and updated Current Outpatient Medications  Medication Sig Dispense Refill  . pantoprazole (PROTONIX) 40 MG tablet Take 1 tablet (40 mg total) by mouth daily. 90 tablet 3  . sildenafil (REVATIO) 20 MG tablet Take 1-5 tabs as directed 90 tablet 11   No current facility-administered medications for this visit.    Allergies-reviewed and updated Allergies  Allergen Reactions  . Caffeine     REACTION: causes hives, itching Pt states he can tolerate small amounts  of caffeine 12/12/2014  . Ibuprofen     REACTION: hives  . Penicillamine   . Penicillins     REACTION: rash    Social History   Socioeconomic History  . Marital status: Married    Spouse name: Thomas Koch  . Number of children: 1  . Years of education: Not on file  . Highest education level: Not on file  Occupational History  . Occupation: Sales promotion account executive  Tobacco Use  . Smoking status: Never Smoker  . Smokeless tobacco: Never Used  Vaping Use  . Vaping Use: Never used  Substance and Sexual Activity  . Alcohol use: Yes    Alcohol/week: 0.0 standard drinks    Comment: social use  . Drug use: No  . Sexual activity: Yes    Partners: Female  Other Topics Concern  . Not on file  Social History Narrative  . Not on file   Social Determinants of Health   Financial Resource Strain:   . Difficulty of Paying Living Expenses: Not on file  Food Insecurity:   . Worried About Charity fundraiser in the Last Year: Not on file  . Ran Out of Food in the Last Year: Not on file  Transportation Needs:   . Lack of Transportation (Medical): Not on file  . Lack of Transportation (Non-Medical): Not on file  Physical Activity:   . Days of Exercise per Week: Not on file  . Minutes of Exercise per Session: Not on file  Stress:   . Feeling of Stress : Not on file  Social Connections:   . Frequency of Communication with Friends and Family: Not on file  . Frequency of Social Gatherings with Friends and Family: Not on file  . Attends Religious Services: Not on file  . Active Member of Clubs or Organizations: Not on file  . Attends Archivist Meetings: Not on file  . Marital Status: Not on file        Objective:  Physical Exam: BP 132/80   Pulse 79   Temp 98.4 F (36.9 C) (Temporal)   Ht 6' 3.5" (1.918 m)   Wt 244 lb 6.4 oz (110.9 kg)   SpO2 98%   BMI 30.14 kg/m   Body mass index is 30.14 kg/m. Wt Readings from Last 3 Encounters:  07/24/20 244 lb 6.4 oz (110.9 kg)  08/16/19 240 lb 3.2 oz (109 kg)  06/28/18 237 lb 6.4 oz (107.7 kg)   Gen: NAD, resting comfortably HEENT: TMs normal bilaterally. OP clear. No thyromegaly noted.  CV: RRR with no murmurs  appreciated Pulm: NWOB, CTAB with no crackles, wheezes, or rhonchi GI: Normal bowel sounds present. Soft, Nontender, Nondistended. MSK: no edema, cyanosis, or clubbing noted Skin: warm, dry Neuro: CN2-12 grossly intact. Strength 5/5 in upper and lower extremities. Reflexes symmetric and intact bilaterally.  Psych: Normal affect and thought content     Adnan Vanvoorhis M. Jerline Pain, MD 07/24/2020 3:12 PM

## 2020-07-24 NOTE — Assessment & Plan Note (Signed)
Stable. Continue Protonix 40 mg daily. Check CBC, CMET, TSH.

## 2020-07-25 LAB — COMPREHENSIVE METABOLIC PANEL
AG Ratio: 1.4 (calc) (ref 1.0–2.5)
ALT: 20 U/L (ref 9–46)
AST: 19 U/L (ref 10–35)
Albumin: 4.2 g/dL (ref 3.6–5.1)
Alkaline phosphatase (APISO): 84 U/L (ref 35–144)
BUN: 15 mg/dL (ref 7–25)
CO2: 25 mmol/L (ref 20–32)
Calcium: 9.2 mg/dL (ref 8.6–10.3)
Chloride: 106 mmol/L (ref 98–110)
Creat: 1.05 mg/dL (ref 0.70–1.33)
Globulin: 2.9 g/dL (calc) (ref 1.9–3.7)
Glucose, Bld: 92 mg/dL (ref 65–99)
Potassium: 4 mmol/L (ref 3.5–5.3)
Sodium: 140 mmol/L (ref 135–146)
Total Bilirubin: 0.7 mg/dL (ref 0.2–1.2)
Total Protein: 7.1 g/dL (ref 6.1–8.1)

## 2020-07-25 LAB — CBC
HCT: 43.8 % (ref 38.5–50.0)
Hemoglobin: 15.1 g/dL (ref 13.2–17.1)
MCH: 31.3 pg (ref 27.0–33.0)
MCHC: 34.5 g/dL (ref 32.0–36.0)
MCV: 90.9 fL (ref 80.0–100.0)
MPV: 8.8 fL (ref 7.5–12.5)
Platelets: 270 10*3/uL (ref 140–400)
RBC: 4.82 10*6/uL (ref 4.20–5.80)
RDW: 12.1 % (ref 11.0–15.0)
WBC: 6.9 10*3/uL (ref 3.8–10.8)

## 2020-07-25 LAB — SARS COV-2 SEROLOGY(COVID-19)AB(IGG,IGM),IMMUNOASSAY
SARS CoV-2 AB IgG: NEGATIVE
SARS CoV-2 IgM: NEGATIVE

## 2020-07-25 LAB — LIPID PANEL
Cholesterol: 162 mg/dL (ref <200)
HDL: 51 mg/dL (ref >=40)
LDL Cholesterol (Calc): 88 mg/dL (calc)
Non-HDL Cholesterol (Calc): 111 mg/dL (calc) (ref <130)
Total CHOL/HDL Ratio: 3.2 (calc) (ref <5.0)
Triglycerides: 126 mg/dL (ref <150)

## 2020-07-25 LAB — TSH: TSH: 0.84 mIU/L (ref 0.40–4.50)

## 2020-07-25 NOTE — Progress Notes (Signed)
Please inform patient of the following:  Covid antibody is negative.  He has still susceptible to infection from COVID-19.  Recommend Covid vaccine as we discussed at his office visit.  All of his other blood work is normal.  Would like for him to continue working on diet and exercise and we can recheck in a year.

## 2020-08-03 DIAGNOSIS — Z1231 Encounter for screening mammogram for malignant neoplasm of breast: Secondary | ICD-10-CM | POA: Diagnosis not present

## 2020-08-10 DIAGNOSIS — M7541 Impingement syndrome of right shoulder: Secondary | ICD-10-CM | POA: Diagnosis not present

## 2020-08-10 DIAGNOSIS — M546 Pain in thoracic spine: Secondary | ICD-10-CM | POA: Diagnosis not present

## 2020-08-10 DIAGNOSIS — M25511 Pain in right shoulder: Secondary | ICD-10-CM | POA: Diagnosis not present

## 2020-08-18 ENCOUNTER — Other Ambulatory Visit: Payer: Self-pay | Admitting: Family Medicine

## 2020-09-04 ENCOUNTER — Other Ambulatory Visit: Payer: Self-pay

## 2020-09-04 ENCOUNTER — Telehealth (INDEPENDENT_AMBULATORY_CARE_PROVIDER_SITE_OTHER): Payer: BC Managed Care – PPO | Admitting: Family Medicine

## 2020-09-04 DIAGNOSIS — U071 COVID-19: Secondary | ICD-10-CM | POA: Diagnosis not present

## 2020-09-04 DIAGNOSIS — R059 Cough, unspecified: Secondary | ICD-10-CM | POA: Diagnosis not present

## 2020-09-04 DIAGNOSIS — R509 Fever, unspecified: Secondary | ICD-10-CM | POA: Diagnosis not present

## 2020-09-04 DIAGNOSIS — R52 Pain, unspecified: Secondary | ICD-10-CM | POA: Diagnosis not present

## 2020-09-04 NOTE — Patient Instructions (Addendum)
°  HOME CARE TIPS:     -[facility]: [phone] (only call if your Covid test is positive and you are interested in monoclonal antibody treatment which is available to those with risk factors within 10 days of symptom onset)  -can use tylenol or aleve if needed for fevers, aches and pains per instructions  -can use nasal saline a few times per day if nasal congestion, sometime a short course of Afrin nasal spray for 3 days can help as well  -stay hydrated, drink plenty of fluids and eat small healthy meals - avoid dairy  -can take 1000 IU Vit D3 and Vit C lozenges per instructions  -check out the CDC website for more information on home care, transmission and treatment for COVID19  -follow up with your doctor in 2-3 days unless improving and feeling better  -stay home while sick, except to seek medical care, and if you have [place] please stay home for a full 10 days since the onset of symptoms PLUS one day of no fever and feeling better.  It was nice to meet you today, and I really hope you are feeling better soon. I help [facility] out with telemedicine visits on Tuesdays and Thursdays and am available for visits on those days. If you have any concerns or questions following this visit please schedule a follow up visit with your Primary Care doctor or seek care at a local urgent care clinic to avoid delays in care.    Seek in person care promptly if your symptoms worsen, new concerns arise or you are not improving with treatment. Call 911 and/or seek emergency care if you symptoms are severe or life threatening.

## 2020-09-04 NOTE — Progress Notes (Signed)
Virtual Visit via Telephone Note  I connected with Thomas Koch on 09/04/20 at  3:20 PM EST by telephone and verified that I am speaking with the correct person using two identifiers.   I discussed the limitations, risks, security and privacy concerns of performing an evaluation and management service by telephone and the availability of in person appointments. I also discussed with the patient that there may be a patient responsible charge related to this service. The patient expressed understanding and agreed to proceed.  Location patient: home,  Location provider: work or home office Participants present for the call: patient, provider Patient did not have a visit with me in the prior 7 days to address this/these issue(s).   History of Present Illness:  Acute telemedicine visit for COVID19: -Onset: 2 days ago -his brother-in-law was sick over the weekend when they were staying with him -Reports had a positive Covid test today -Symptoms include: fatigue, fever up to 102 the first night (lower today), cough, nasal congestion, body aches -Denies:SOB, CP, NVD, inability to get out of bed, eat, drink -Has tried:OTC meds, vitamins -Pertinent past medical history: see below -Pertinent medication allergies:see allergies -COVID-19 vaccine status: not vaccinated   Observations/Objective: Patient sounds cheerful and well on the phone. I do not appreciate any SOB. Speech and thought processing are grossly intact. Patient reported vitals:  Assessment and Plan:  COVID-19  -we discussed possible serious and likely etiologies, options for evaluation and workup, limitations of telemedicine visit vs in person visit, treatment, treatment risks and precautions. Pt prefers to treat via telemedicine empirically rather than in person at this moment.  Likely COVID-19 given the symptoms plus positive test.  He is interested in Mab infusion.  Sent message to treatment center and provided patient with  the contact information to the center.  Discussed symptomatic care.  Advised home isolation.  Discussed potential complications and precautions. Work/School slipped offered:  declined Scheduled follow up with PCP offered: He agrees to follow-up if needed Advised to seek prompt in person care if worsening, new symptoms arise, or if is not improving with treatment. Advised of options for inperson care in case PCP office not available. Did let the patient know that I only do telemedicine shifts for [facility] on Tuesdays and Thursdays and advised a follow up visit with PCP or at an [facility] if has further questions or concerns.   Follow Up Instructions:  I did not refer this patient for an OV with me in the next 24 hours for this/these issue(s).  I discussed the assessment and treatment plan with the patient. The patient was provided an opportunity to ask questions and all were answered. The patient agreed with the plan and demonstrated an understanding of the instructions.   I spent 18 minutes on this encounter.   Lucretia Kern, DO

## 2020-09-05 ENCOUNTER — Encounter: Payer: Self-pay | Admitting: Oncology

## 2020-09-05 ENCOUNTER — Telehealth: Payer: Self-pay | Admitting: Oncology

## 2020-09-05 NOTE — Telephone Encounter (Signed)
Re: Mab Infusion  Called to Discuss with patient about Covid symptoms and the use of regeneron, a monoclonal antibody infusion for those with mild to moderate Covid symptoms and at a high risk of hospitalization.     Pt is qualified for this infusion at the [facility] center due to co-morbid conditions and/or a member of an at-risk group.    Past Medical History:  Diagnosis Date  . Lumbar back pain   . Physical exam, annual     Specific risk condition-Obesity BMI > 25   Unable to reach pt. Left VM and MCM.  Rulon Abide, AGNP-C [phone] ([place])

## 2020-09-10 DIAGNOSIS — L820 Inflamed seborrheic keratosis: Secondary | ICD-10-CM | POA: Diagnosis not present

## 2020-09-10 DIAGNOSIS — Z85828 Personal history of other malignant neoplasm of skin: Secondary | ICD-10-CM | POA: Diagnosis not present

## 2020-09-10 DIAGNOSIS — Z85820 Personal history of malignant melanoma of skin: Secondary | ICD-10-CM | POA: Diagnosis not present

## 2020-09-10 DIAGNOSIS — D1801 Hemangioma of skin and subcutaneous tissue: Secondary | ICD-10-CM | POA: Diagnosis not present

## 2020-09-10 DIAGNOSIS — L821 Other seborrheic keratosis: Secondary | ICD-10-CM | POA: Diagnosis not present

## 2020-09-20 DIAGNOSIS — M5134 Other intervertebral disc degeneration, thoracic region: Secondary | ICD-10-CM | POA: Diagnosis not present

## 2020-09-28 DIAGNOSIS — M546 Pain in thoracic spine: Secondary | ICD-10-CM | POA: Diagnosis not present

## 2020-10-18 DIAGNOSIS — D1724 Benign lipomatous neoplasm of skin and subcutaneous tissue of left leg: Secondary | ICD-10-CM | POA: Diagnosis not present

## 2020-10-25 DIAGNOSIS — M519 Unspecified thoracic, thoracolumbar and lumbosacral intervertebral disc disorder: Secondary | ICD-10-CM | POA: Diagnosis not present

## 2020-10-25 DIAGNOSIS — M546 Pain in thoracic spine: Secondary | ICD-10-CM | POA: Diagnosis not present

## 2021-01-10 DIAGNOSIS — M79632 Pain in left forearm: Secondary | ICD-10-CM | POA: Diagnosis not present

## 2021-01-10 DIAGNOSIS — M7541 Impingement syndrome of right shoulder: Secondary | ICD-10-CM | POA: Diagnosis not present

## 2021-02-28 DIAGNOSIS — M79632 Pain in left forearm: Secondary | ICD-10-CM | POA: Diagnosis not present

## 2021-02-28 DIAGNOSIS — M25522 Pain in left elbow: Secondary | ICD-10-CM | POA: Diagnosis not present

## 2021-03-13 DIAGNOSIS — D1801 Hemangioma of skin and subcutaneous tissue: Secondary | ICD-10-CM | POA: Diagnosis not present

## 2021-03-13 DIAGNOSIS — Z85820 Personal history of malignant melanoma of skin: Secondary | ICD-10-CM | POA: Diagnosis not present

## 2021-03-13 DIAGNOSIS — L814 Other melanin hyperpigmentation: Secondary | ICD-10-CM | POA: Diagnosis not present

## 2021-03-13 DIAGNOSIS — Z85828 Personal history of other malignant neoplasm of skin: Secondary | ICD-10-CM | POA: Diagnosis not present

## 2021-08-12 ENCOUNTER — Other Ambulatory Visit: Payer: Self-pay

## 2021-08-12 ENCOUNTER — Ambulatory Visit (INDEPENDENT_AMBULATORY_CARE_PROVIDER_SITE_OTHER): Payer: BC Managed Care – PPO | Admitting: Family Medicine

## 2021-08-12 VITALS — BP 130/94 | HR 75 | Temp 98.2°F | Ht 76.0 in | Wt 236.2 lb

## 2021-08-12 DIAGNOSIS — N529 Male erectile dysfunction, unspecified: Secondary | ICD-10-CM

## 2021-08-12 DIAGNOSIS — Z125 Encounter for screening for malignant neoplasm of prostate: Secondary | ICD-10-CM

## 2021-08-12 DIAGNOSIS — Z13220 Encounter for screening for lipoid disorders: Secondary | ICD-10-CM | POA: Diagnosis not present

## 2021-08-12 DIAGNOSIS — Z6828 Body mass index (BMI) 28.0-28.9, adult: Secondary | ICD-10-CM

## 2021-08-12 DIAGNOSIS — G5602 Carpal tunnel syndrome, left upper limb: Secondary | ICD-10-CM

## 2021-08-12 DIAGNOSIS — K200 Eosinophilic esophagitis: Secondary | ICD-10-CM

## 2021-08-12 DIAGNOSIS — Z0001 Encounter for general adult medical examination with abnormal findings: Secondary | ICD-10-CM | POA: Diagnosis not present

## 2021-08-12 DIAGNOSIS — E663 Overweight: Secondary | ICD-10-CM

## 2021-08-12 MED ORDER — PANTOPRAZOLE SODIUM 40 MG PO TBEC: 40.0000 mg | DELAYED_RELEASE_TABLET | Freq: Every day | ORAL | 3 refills | Status: DC

## 2021-08-12 MED ORDER — SILDENAFIL CITRATE 20 MG PO TABS: ORAL_TABLET | ORAL | 11 refills | Status: DC

## 2021-08-12 NOTE — Progress Notes (Signed)
Chief Complaint:  Thomas Koch is a 50 y.o. male who presents today for his annual comprehensive physical exam.    Assessment/Plan:  New/Acute Problems: Carpal tunnel syndrome No red flags.  Discussed home exercises and handout was given.  We will place referral to sports medicine for further evaluation and management.  Chronic Problems Addressed Today: Erectile dysfunction Stable.  Will refill sildenafil.  Eosinophilic esophagitis Stable on Protonix 40 mg daily.   Body mass index is 28.75 kg/m. / Overweight  BMI Metric Follow Up - 08/12/21 1513       BMI Metric Follow Up-Please document annually   BMI Metric Follow Up Education provided              Preventative Healthcare: Check Labs. Flu shot deferred. UTD on colon cancer screening.   Patient Counseling(The following topics were reviewed and/or handout was given):  -Nutrition: Stressed importance of moderation in sodium/caffeine intake, saturated fat and cholesterol, caloric balance, sufficient intake of fresh fruits, vegetables, and fiber.  -Stressed the importance of regular exercise.   -Substance Abuse: Discussed cessation/primary prevention of tobacco, alcohol, or other drug use; driving or other dangerous activities under the influence; availability of treatment for abuse.   -Injury prevention: Discussed safety belts, safety helmets, smoke detector, smoking near bedding or upholstery.   -Sexuality: Discussed sexually transmitted diseases, partner selection, use of condoms, avoidance of unintended pregnancy and contraceptive alternatives.   -Dental health: Discussed importance of regular tooth brushing, flossing, and dental visits.  -Health maintenance and immunizations reviewed. Please refer to Health maintenance section.  Return to care in 1 year for next preventative visit.     Subjective:  HPI:  He has no acute complaints today.   During a workout, he injured his arm where he experienced pinching  sensation and swelling on his forearm, above the elbow. The condition had abated, but recently when picking up a dumbbell he experienced a line of searing sensation from the aforementioned area down his arm to his hand, and has since experienced a numbness in his fingers. Blood flow to the numb area is normal and sufficient.  In particular, his pinky and index finger of his left hand are affected. He states that the pinky has maintained ~85% of sensation, and that the index has lost most of it. Additionally, he states that the condition has been worsening in the fingers after having improved in condition in his forearm.  BP Readings from Last 3 Encounters:  08/12/21 (!) 130/94  07/24/20 132/80  08/16/19 110/74    Lifestyle Diet: Reasonably healthy diet, could incorporate more fruit and vegetables. Exercise: Does workouts/lifting.   Depression screen PHQ 2/9 08/12/2021  Decreased Interest 1  Down, Depressed, Hopeless 0  PHQ - 2 Score 1    Health Maintenance Due  Topic Date Due   Pneumococcal Vaccine 29-47 Years old (1 - PCV) Never done   HIV Screening  Never done   Hepatitis C Screening  Never done   Zoster Vaccines- Shingrix (1 of 2) Never done     ROS: Per HPI, otherwise a complete review of systems was negative.   PMH:  The following were reviewed and entered/updated in epic: Past Medical History:  Diagnosis Date   Lumbar back pain    Physical exam, annual    Patient Active Problem List   Diagnosis Date Noted   Erectile dysfunction 08/16/2019   Chronic back pain 08/16/2019   History of melanoma 99/24/2683   Eosinophilic esophagitis 41/96/2229   Seasonal  allergic rhinitis 01/11/2018   Allergic conjunctivitis 01/11/2018   Food allergy 01/11/2018   Sleep apnea, unspecified 08/13/2017   Past Surgical History:  Procedure Laterality Date   melanona removed  08/2012   on his upper back    Family History  Problem Relation Age of Onset   COPD Father     Hyperlipidemia Father    Hypertension Father    Osteoporosis Mother    Colon polyps Mother    Hyperlipidemia Mother    Heart attack Maternal Grandfather 25   Diabetes Paternal Grandmother    Heart disease Paternal Grandmother    Heart disease Paternal Grandfather     Medications- reviewed and updated Current Outpatient Medications  Medication Sig Dispense Refill   pantoprazole (PROTONIX) 40 MG tablet Take 1 tablet (40 mg total) by mouth daily. 90 tablet 3   sildenafil (REVATIO) 20 MG tablet TAKE 1-5 TABLETS BY MOUTH AS DIRECTED 90 tablet 11   No current facility-administered medications for this visit.    Allergies-reviewed and updated Allergies  Allergen Reactions   Caffeine     REACTION: causes hives, itching Pt states he can tolerate small amounts of caffeine 12/12/2014   Ibuprofen     REACTION: hives   Penicillamine    Penicillins     REACTION: rash    Social History   Socioeconomic History   Marital status: Married    Spouse name: Ameya Kutz   Number of children: 1   Years of education: Not on file   Highest education level: Not on file  Occupational History   Occupation: Sales promotion account executive  Tobacco Use   Smoking status: Never   Smokeless tobacco: Never  Vaping Use   Vaping Use: Never used  Substance and Sexual Activity   Alcohol use: Yes    Alcohol/week: 0.0 standard drinks    Comment: social use   Drug use: No   Sexual activity: Yes    Partners: Female  Other Topics Concern   Not on file  Social History Narrative   Not on file   Social Determinants of Health   Financial Resource Strain: Not on file  Food Insecurity: Not on file  Transportation Needs: Not on file  Physical Activity: Not on file  Stress: Not on file  Social Connections: Not on file        Objective:  Physical Exam: BP (!) 130/94 (BP Location: Right Arm, Patient Position: Sitting, Cuff Size: Large)   Pulse 75   Temp 98.2 F (36.8 C) (Temporal)   Ht 6' 4"  (1.93 m)   Wt 236  lb 3.2 oz (107.1 kg)   SpO2 96%   BMI 28.75 kg/m   Body mass index is 28.75 kg/m. Wt Readings from Last 3 Encounters:  08/12/21 236 lb 3.2 oz (107.1 kg)  07/24/20 244 lb 6.4 oz (110.9 kg)  08/16/19 240 lb 3.2 oz (109 kg)   Gen: NAD, resting comfortably HEENT: TMs normal bilaterally. OP clear. No thyromegaly noted.  CV: RRR with no murmurs appreciated Pulm: NWOB, CTAB with no crackles, wheezes, or rhonchi GI: Normal bowel sounds present. Soft, Nontender, Nondistended. MSK: no edema, cyanosis, or clubbing noted.  Positive Tinel's sign left wrist. Skin: warm, dry Neuro: CN2-12 grossly intact. Strength 5/5 in upper and lower extremities. Reflexes symmetric and intact bilaterally.  Psych: Normal affect and thought content     I,Jordan Kelly,acting as a scribe for Dimas Chyle, MD.,have documented all relevant documentation on the behalf of Dimas Chyle, MD,as directed by  Dimas Chyle, MD while in the presence of Dimas Chyle, MD.  I, Dimas Chyle, MD, have reviewed all documentation for this visit. The documentation on 08/12/21 for the exam, diagnosis, procedures, and orders are all accurate and complete.  Algis Greenhouse. Jerline Pain, MD 08/12/2021 3:13 PM

## 2021-08-12 NOTE — Assessment & Plan Note (Signed)
Stable.  Will refill sildenafil.

## 2021-08-12 NOTE — Patient Instructions (Signed)
It was very nice to see you today!  We will Check blood work today.  Please work on the exercises for your hand.  I will place a referral for you to see a sports medicine doctor.  We will refill your medications today.  I will see back in 1 year.  Come back to see me sooner if needed.  Take care, Dr Jerline Pain  PLEASE NOTE:  If you had any lab tests please let us know if you have not heard back within a few days. You may see your results on mychart before we have a chance to review them but we will give you a call once they are reviewed by [place]. If we ordered any referrals today, please let us know if you have not heard from their office within the next week.   Please try these tips to maintain a healthy lifestyle:  Eat at least 3 REAL meals and 1-2 snacks per day.  Aim for no more than 5 hours between eating.  If you eat breakfast, please do so within one hour of getting up.   Each meal should contain half fruits/vegetables, one quarter protein, and one quarter carbs (no bigger than a computer mouse)  Cut down on sweet beverages. This includes juice, soda, and sweet tea.   Drink at least 1 glass of water with each meal and aim for at least 8 glasses per day  Exercise at least 150 minutes every week.    Preventive Care 56-26 Years Old, Male Preventive care refers to lifestyle choices and visits with your health care provider that can promote health and wellness. This includes: A yearly physical exam. This is also called an annual wellness visit. Regular dental and eye exams. Immunizations. Screening for certain conditions. Healthy lifestyle choices, such as: Eating a healthy diet. Getting regular exercise. Not using drugs or products that contain nicotine and tobacco. Limiting alcohol use. What can I expect for my preventive care visit? Physical exam Your health care provider will check your: Height and weight. These may be used to calculate your BMI (body mass index). BMI is a  measurement that tells if you are at a healthy weight. Heart rate and blood pressure. Body temperature. Skin for abnormal spots. Counseling Your health care provider may ask you questions about your: Past medical problems. Family's medical history. Alcohol, tobacco, and drug use. Emotional well-being. Home life and relationship well-being. Sexual activity. Diet, exercise, and sleep habits. Work and work Statistician. Access to firearms. What immunizations do I need? Vaccines are usually given at various ages, according to a schedule. Your health care provider will recommend vaccines for you based on your age, medical history, and lifestyle or other factors, such as travel or where you work. What tests do I need? Blood tests Lipid and cholesterol levels. These may be checked every 5 years, or more often if you are over 48 years old. Hepatitis C test. Hepatitis B test. Screening Lung cancer screening. You may have this screening every year starting at age 48 if you have a 30-pack-year history of smoking and currently smoke or have quit within the past 15 years. Prostate cancer screening. Recommendations will vary depending on your family history and other risks. Genital exam to check for testicular cancer or hernias. Colorectal cancer screening. All adults should have this screening starting at age 60 and continuing until age 76. Your health care provider may recommend screening at age 36 if you are at increased risk. You will have tests  every 1-10 years, depending on your results and the type of screening test. Diabetes screening. This is done by checking your blood sugar (glucose) after you have not eaten for a while (fasting). You may have this done every 1-3 years. STD (sexually transmitted disease) testing, if you are at risk. Follow these instructions at home: Eating and drinking  Eat a diet that includes fresh fruits and vegetables, whole grains, lean protein, and low-fat  dairy products. Take vitamin and mineral supplements as recommended by your health care provider. Do not drink alcohol if your health care provider tells you not to drink. If you drink alcohol: Limit how much you have to 0-2 drinks a day. Be aware of how much alcohol is in your drink. In the [place] one drink equals one 12 oz bottle of beer (355 mL), one 5 oz glass of wine (148 mL), or one 1 oz glass of hard liquor (44 mL). Lifestyle Take daily care of your teeth and gums. Brush your teeth every morning and night with fluoride toothpaste. Floss one time each day. Stay active. Exercise for at least 30 minutes 5 or more days each week. Do not use any products that contain nicotine or tobacco, such as cigarettes, e-cigarettes, and chewing tobacco. If you need help quitting, ask your health care provider. Do not use drugs. If you are sexually active, practice safe sex. Use a condom or other form of protection to prevent STIs (sexually transmitted infections). If told by your health care provider, take low-dose aspirin daily starting at age 65. Find healthy ways to cope with stress, such as: Meditation, yoga, or listening to music. Journaling. Talking to a trusted person. Spending time with friends and family. Safety Always wear your seat belt while driving or riding in a vehicle. Do not drive: If you have been drinking alcohol. Do not ride with someone who has been drinking. When you are tired or distracted. While texting. Wear a helmet and other protective equipment during sports activities. If you have firearms in your house, make sure you follow all gun safety procedures. What's next? Go to your health care provider once a year for an annual wellness visit. Ask your health care provider how often you should have your eyes and teeth checked. Stay up to date on all vaccines. This information is not intended to replace advice given to you by your health care provider. Make sure you discuss  any questions you have with your health care provider. Document Revised: 12/07/2020 Document Reviewed: 09/23/2018 Elsevier Patient Education  2022 [employer].

## 2021-08-12 NOTE — Assessment & Plan Note (Signed)
Stable on Protonix 40 mg daily.

## 2021-08-13 LAB — LIPID PANEL
Cholesterol: 172 mg/dL (ref 0–200)
HDL: 54.7 mg/dL (ref >39.00)
LDL Cholesterol: 101 mg/dL — ABNORMAL HIGH (ref 0–99)
NonHDL: 117.63
Total CHOL/HDL Ratio: 3
Triglycerides: 85 mg/dL (ref 0.0–149.0)
VLDL: 17 mg/dL (ref 0.0–40.0)

## 2021-08-13 LAB — CBC
HCT: 43.3 % (ref 39.0–52.0)
Hemoglobin: 14.7 g/dL (ref 13.0–17.0)
MCHC: 33.8 g/dL (ref 30.0–36.0)
MCV: 91.6 fl (ref 78.0–100.0)
Platelets: 235 10*3/uL (ref 150.0–400.0)
RBC: 4.73 Mil/uL (ref 4.22–5.81)
RDW: 12.7 % (ref 11.5–15.5)
WBC: 7.6 10*3/uL (ref 4.0–10.5)

## 2021-08-13 LAB — COMPREHENSIVE METABOLIC PANEL
ALT: 30 U/L (ref 0–53)
AST: 22 U/L (ref 0–37)
Albumin: 4.2 g/dL (ref 3.5–5.2)
Alkaline Phosphatase: 76 U/L (ref 39–117)
BUN: 14 mg/dL (ref 6–23)
CO2: 28 mEq/L (ref 19–32)
Calcium: 9 mg/dL (ref 8.4–10.5)
Chloride: 105 mEq/L (ref 96–112)
Creatinine, Ser: 0.98 mg/dL (ref 0.40–1.50)
GFR: 89.34 mL/min (ref >60.00)
Glucose, Bld: 94 mg/dL (ref 70–99)
Potassium: 3.9 mEq/L (ref 3.5–5.1)
Sodium: 141 mEq/L (ref 135–145)
Total Bilirubin: 1.2 mg/dL (ref 0.2–1.2)
Total Protein: 6.8 g/dL (ref 6.0–8.3)

## 2021-08-13 LAB — PSA: PSA: 0.16 ng/mL (ref 0.10–4.00)

## 2021-08-13 LAB — TSH: TSH: 1 u[IU]/mL (ref 0.35–5.50)

## 2021-08-13 LAB — TESTOSTERONE: Testosterone: 355.54 ng/dL (ref 300.00–890.00)

## 2021-08-13 NOTE — Progress Notes (Signed)
Please inform patient of the following:  His "bad" cholesterol is just a little borderline elevated but everything else is NORMAL. Would like for him to keep up the good work and we can recheck in a year.  Thomas Koch. Jerline Pain, MD 08/13/2021 12:52 PM

## 2021-08-14 ENCOUNTER — Encounter: Payer: Self-pay | Admitting: Family Medicine

## 2021-08-14 ENCOUNTER — Ambulatory Visit: Payer: Self-pay

## 2021-08-14 ENCOUNTER — Ambulatory Visit (INDEPENDENT_AMBULATORY_CARE_PROVIDER_SITE_OTHER): Payer: BC Managed Care – PPO

## 2021-08-14 ENCOUNTER — Other Ambulatory Visit: Payer: Self-pay

## 2021-08-14 ENCOUNTER — Ambulatory Visit: Payer: BC Managed Care – PPO | Admitting: Family Medicine

## 2021-08-14 VITALS — BP 112/80 | HR 73 | Ht 76.0 in | Wt 239.6 lb

## 2021-08-14 DIAGNOSIS — M25532 Pain in left wrist: Secondary | ICD-10-CM

## 2021-08-14 DIAGNOSIS — G5622 Lesion of ulnar nerve, left upper limb: Secondary | ICD-10-CM | POA: Diagnosis not present

## 2021-08-14 DIAGNOSIS — M5412 Radiculopathy, cervical region: Secondary | ICD-10-CM

## 2021-08-14 DIAGNOSIS — M50322 Other cervical disc degeneration at C5-C6 level: Secondary | ICD-10-CM | POA: Diagnosis not present

## 2021-08-14 DIAGNOSIS — R200 Anesthesia of skin: Secondary | ICD-10-CM

## 2021-08-14 NOTE — Patient Instructions (Addendum)
Thank you for coming in today.   Please get an Xray today before you leave   Neurology Referral.   Please complete the exercises that the athletic trainer went over with you:  View at [email or web address] using code: [id number]  Cubital tunnel brace on [facility] after the report is back from the nerve study.   Cubital Tunnel Syndrome Cubital tunnel syndrome is a condition that causes pain and weakness of the forearm and hand. It happens when one of the nerves that runs along the inside of the elbow joint (ulnar nerve) becomes irritated. This condition is usually caused by repeated arm motions that are done during sports or work-related activities. What are the causes? This condition may be caused by: Increased pressure on the ulnar nerve at the elbow, arm, or forearm. This can result from: Irritation caused by repeated elbow bending. Poorly healed elbow fractures. Tumors in the elbow. These are usually noncancerous (benign). Scar tissue that develops in the elbow after an injury. Bony growths (spurs) near the ulnar nerve. Stretching of the nerve due to loose elbow ligaments. Trauma to the nerve at the elbow. What increases the risk? The following factors may make you more likely to develop this condition: Doing manual labor that requires frequent bending of the elbow. Playing sports that include repeated or strenuous throwing motions, such as baseball. Playing contact sports, such as football or lacrosse. Not warming up properly before activities. Having diabetes. Having an underactive thyroid (hypothyroidism). What are the signs or symptoms? Symptoms of this condition include: Clumsiness or weakness of the hand. Tenderness of the inner elbow. Aching or soreness of the inner elbow, forearm, or fingers, especially the little finger or the ring finger. Increased pain when forcing the elbow to bend. Reduced control when throwing objects. Tingling, numbness, or a burning  feeling inside the forearm or in part of the hand or fingers, especially the little finger or the ring finger. Sharp pains that shoot from the elbow down to the wrist and hand. The inability to grip or pinch hard. How is this diagnosed? This condition is diagnosed based on: Your symptoms and medical history. Your health care provider will also ask for details about any injury. A physical exam. You may also have tests, including: Electromyogram (EMG). This test measures electrical signals sent by your nerves into the muscles. Nerve conduction study. This test measures how well electrical signals pass through your nerves. Imaging tests, such as X-rays, ultrasound, and MRI. These tests check for possible causes of your condition. How is this treated? This condition may be treated by: Stopping the activities that are causing your symptoms to get worse. Icing and taking medicines to reduce pain and swelling. Wearing a splint to prevent your elbow from bending, or wearing an elbow pad where the ulnar nerve is closest to the skin. Working with a physical therapist in less severe cases. This may help to: Decrease your symptoms. Improve the strength and range of motion of your elbow, forearm, and hand. If these treatments do not help, surgery may be needed. Follow these instructions at home: If you have a splint: Wear the splint as told by your health care provider. Remove it only as told by your health care provider. Loosen the splint if your fingers tingle, become numb, or turn cold and blue. Keep the splint clean. If the splint is not waterproof: Do not let it get wet. Cover it with a watertight covering when you take a bath or shower. Managing  pain, stiffness, and swelling  If directed, put ice on the injured area: Put ice in a plastic bag. Place a towel between your skin and the bag. Leave the ice on for 20 minutes, 2-3 times a day. Move your fingers often to avoid stiffness and to  lessen swelling. Raise (elevate) the injured area above the level of your heart while you are sitting or lying down. General instructions Take over-the-counter and prescription medicines only as told by your health care provider. Do any exercise or physical therapy as told by your health care provider. Do not drive or use heavy machinery while taking prescription pain medicine. If you were given an elbow pad, wear it as told by your health care provider. Keep all follow-up visits as told by your health care provider. This is important. Contact a health care provider if: Your symptoms get worse. Your symptoms do not get better with treatment. You have new pain. Your hand on the injured side feels numb or cold. Summary Cubital tunnel syndrome is a condition that causes pain and weakness of the forearm and hand. You are more likely to develop this condition if you do work or play sports that involve repeated arm movements. This condition is often treated by stopping repetitive activities, applying ice, and using anti-inflammatory medicines. In rare cases, surgery may be needed. This information is not intended to replace advice given to you by your health care provider. Make sure you discuss any questions you have with your health care provider. Document Revised: 11/21/2020 Document Reviewed: 11/21/2020 Elsevier Patient Education  [place].

## 2021-08-14 NOTE — Progress Notes (Signed)
I, Peterson Lombard, LAT, ATC acting as a scribe for Lynne Leader, MD.  Subjective:    CC: L elbow pain w/ paraesthesia  HPI: Pt is a 50 y/o male c/o L elbow ongoing for for a few month. Pt was seen Dr. Maxie Better in Feb c/o anterior L elbow pain and was given a steroid injection and advised to rest. Pt notes this pain has gotten somewhat better, but shifted into the L forearm and wrist when he went to pick up a dumb bell. Pt locates numbness to the L index finger and 5th finger. Pt will only experience L elbow when working out.  Grip strength: no Numbness/tingling: yes Aggravates: working, touching Treatments tried: elbow steroid injection, resting  Dx imaging: 03/28/13 L wrist XR  Pertinent review of Systems: No fevers or chills  Relevant historical information: Sleep apnea.   Objective:    Vitals:   08/14/21 1259  BP: 112/80  Pulse: 73  SpO2: 96%   General: Well Developed, well nourished, and in no acute distress.   MSK:  C-spine: Normal-appearing Nontender.  Normal cervical motion. Negative Spurling's test.  Left elbow: Normal-appearing Mildly tender palpation anterior medial elbow.  Nontender at biceps tendon but tender palpation deep near the insertion of the brachial radialis. Some pain with resisted elbow flexion but no pain with resisted supination. Positive Tinel's at cubital tunnel producing paresthesias fifth digit.  Left wrist normal-appearing normal motion and strength.  Negative Tinel's carpal tunnel and Guyon's canal  Patient has reduced sensation to touch along the dorsal aspect of the second digit and along the ulnar aspect of the second digit but not along the palmar aspect of the second digit. Hand strength is intact.   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  No acute fractures. Await formal radiology review   Impression and Recommendations:     Assessment and Plan: 50 y.o. male with left anterior elbow pain and paresthesias involving the left hand.  Left anterior elbow pain: This occurred with a lifting injury several months ago.  I believe he injured his distal brachial radialis muscle or tendon and has some residual tendinopathy now.  Plan to treat with home exercise program taught in clinic today by ATC.  Recheck as below.  Left fifth digit paresthesias most likely due to cubital tunnel syndrome.  He does have reproducible paresthesias with Tinel's at the cubital tunnel but negative Tinel's at Guyon's canal.  Plan for night splint.  Additionally we will proceed with a evaluation with a nerve conduction study as below.  And recheck as below.  Right index finger numbness.  This is the most difficult to evaluate or understand.  The distribution of his numbness along the dorsal and ulnar aspect of his index finger and into the dorsal hand is not consistent with typical nerve impingement dermatomal patterns.  It is not consistent with median nerve or radial nerve.  Is most consistent with C7 radiculopathy.  He does have some degenerative changes in his cervical spine at this level which could potentially cause this.  Plan for nerve conduction study which given his density of numbness should pick up on source of numbness/paresthesia.   Recheck after nerve conduction study and results are back.  Discussed medication safety.  PDMP not reviewed this encounter. Orders Placed This Encounter  Procedures   DG Cervical Spine 2 or 3 views    Standing Status:   Future  Number of Occurrences:   1    Standing Expiration Date:   08/14/2022    Order Specific Question:   Reason for Exam (SYMPTOM  OR DIAGNOSIS REQUIRED)    Answer:   eval poss left C7 rad    Order Specific Question:   Preferred imaging location?    Answer:   [facility]   Ambulatory referral to Neurology    Referral Priority:   Routine    Referral Type:   Consultation     Referral Reason:   Specialty Services Required    Requested Specialty:   Neurology    Number of Visits Requested:   1   NCV with EMG(electromyography)    Standing Status:   Future    Standing Expiration Date:   08/14/2022   No orders of the defined types were placed in this encounter.   Discussed warning signs or symptoms. Please see discharge instructions. Patient expresses understanding.   The above documentation has been reviewed and is accurate and complete Lynne Leader, M.D.

## 2021-08-15 ENCOUNTER — Encounter: Payer: Self-pay | Admitting: Family Medicine

## 2021-08-15 NOTE — Progress Notes (Signed)
Cervical spine x-ray does show some mild degenerative/arthritis changes at C5-6.  Potentially this could cause a pinched nerve at C6 which could cause some thumb numbness but probably not index finger numbness.  If needed in the future and MRI may be helpful.  For now we will proceed with nerve conduction study as ordered.

## 2021-08-19 NOTE — Telephone Encounter (Signed)
See note

## 2021-08-19 NOTE — Telephone Encounter (Signed)
Spoke with patient, lab results given. Patient want to know what he needs to do to get his Testosterone level over 400

## 2021-08-22 ENCOUNTER — Encounter: Payer: Self-pay | Admitting: Neurology

## 2021-08-22 NOTE — Telephone Encounter (Signed)
Spoke with patient  Patient verbalized understanding  Refused referral

## 2021-09-09 DIAGNOSIS — L820 Inflamed seborrheic keratosis: Secondary | ICD-10-CM | POA: Diagnosis not present

## 2021-09-09 DIAGNOSIS — Z85828 Personal history of other malignant neoplasm of skin: Secondary | ICD-10-CM | POA: Diagnosis not present

## 2021-09-09 DIAGNOSIS — D225 Melanocytic nevi of trunk: Secondary | ICD-10-CM | POA: Diagnosis not present

## 2021-09-09 DIAGNOSIS — Z85820 Personal history of malignant melanoma of skin: Secondary | ICD-10-CM | POA: Diagnosis not present

## 2021-09-17 ENCOUNTER — Encounter: Payer: BC Managed Care – PPO | Admitting: Neurology

## 2021-09-18 NOTE — Telephone Encounter (Signed)
See note

## 2021-09-19 NOTE — Telephone Encounter (Signed)
Please schedule an appointment with Dr Jerline Pain

## 2021-09-25 ENCOUNTER — Other Ambulatory Visit: Payer: Self-pay

## 2021-09-25 ENCOUNTER — Ambulatory Visit: Payer: BC Managed Care – PPO | Admitting: Neurology

## 2021-09-25 DIAGNOSIS — M25532 Pain in left wrist: Secondary | ICD-10-CM | POA: Diagnosis not present

## 2021-09-25 DIAGNOSIS — G5622 Lesion of ulnar nerve, left upper limb: Secondary | ICD-10-CM

## 2021-09-25 DIAGNOSIS — R200 Anesthesia of skin: Secondary | ICD-10-CM

## 2021-09-25 NOTE — Procedures (Signed)
[facility]  [address]  [place], [place] [address] Tel: [phone] Fax:  [phone] Test Date:  09/25/2021  Patient: Thomas Koch DOB: 24-Sep-1971 Physician: [name], DO  Sex: Male Height: 6' 4"  Ref Phys: Lynne Leader, M.D.  ID#: [id number]   Technician:    Patient Complaints: This is a 50 year old man with left hand numbness and tingling.  NCV & EMG Findings: Extensive electrodiagnostic testing of the left upper extremity shows:  Left sensory response shows prolonged latency (3.7 ms).  Left median, radial, and mixed palmar sensory responses are within normal limits. Left ulnar motor response shows prolonged latency (3.4 ms) and decreased conduction velocity across the elbow (A Elbow-B Elbow, 48 m/s).  The left median motor response is within normal limits.   There is no evidence of active or chronic motor axonal loss changes affecting any of the tested muscles.  Motor unit configuration and recruitment pattern is within normal limits.  Impression: Left ulnar neuropathy with slowing across the elbow, demyelinating.  Overall, these findings are moderate in degree electrically. There is no evidence of carpal tunnel syndrome or cervical radiculopathy affecting the left upper extremity.   ___________________________ [name], DO    Nerve Conduction Studies Anti Sensory Summary Table   Stim Site NR Peak (ms) Norm Peak (ms) P-T Amp (V) Norm P-T Amp  Left DorsCutan Anti Sensory (Dorsum 5th MC)  34C  Wrist    3.1 <3.1 14.6 >10  Left Median Anti Sensory (2nd Digit)  34C  Wrist    3.4 <3.6 23.7 >15  Left Radial Anti Sensory (Base 1st Digit)  34C  Wrist    2.3 <2.7 22.6 >14  Left Ulnar Anti Sensory (5th Digit)  34C  Wrist    3.7 <3.1 18.7 >10   Motor Summary Table   Stim Site NR Onset (ms) Norm Onset (ms) O-P Amp (mV) Norm O-P Amp Site1 Site2 Delta-0 (ms) Dist (cm) Vel (m/s) Norm Vel (m/s)  Left Median Motor (Abd Poll Brev)  34C  Wrist     3.5 <4.0 11.4 >6 Elbow Wrist 6.0 32.0 53 >50  Elbow    9.5  10.8         Left Ulnar Motor (Abd Dig Minimi)  34C  Wrist    3.4 <3.1 10.7 >7 B Elbow Wrist 4.7 26.0 55 >50  B Elbow    8.1  10.7  A Elbow B Elbow 2.1 10.0 48 >50  A Elbow    10.2  10.5          Comparison Summary Table   Stim Site NR Peak (ms) Norm Peak (ms) P-T Amp (V) Site1 Site2 Delta-P (ms) Norm Delta (ms)  Left Median/Ulnar Palm Comparison (Wrist - 8cm)  34C  Median Palm    1.9 <2.2 43.9 Median Palm Ulnar Palm 0.2   Ulnar Palm    2.1 <2.2 9.7       EMG   Side Muscle Ins Act Fibs Psw Fasc Number Recrt Dur Dur. Amp Amp. Poly Poly. Comment  Left 1stDorInt Nml Nml Nml Nml Nml Nml Nml Nml Nml Nml Nml Nml N/A  Left PronatorTeres Nml Nml Nml Nml Nml Nml Nml Nml Nml Nml Nml Nml N/A  Left Biceps Nml Nml Nml Nml Nml Nml Nml Nml Nml Nml Nml Nml N/A  Left Triceps Nml Nml Nml Nml Nml Nml Nml Nml Nml Nml Nml Nml N/A  Left Deltoid Nml Nml Nml Nml Nml Nml Nml Nml Nml Nml Nml  Nml N/A  Left ABD Dig Min Nml Nml Nml Nml Nml Nml Nml Nml Nml Nml Nml Nml N/A  Left FlexCarpiUln Nml Nml Nml Nml Nml Nml Nml Nml Nml Nml Nml Nml N/A      Waveforms:

## 2021-09-26 NOTE — Progress Notes (Signed)
Nerve conduction study shows medium cubital tunnel syndrome affecting the ulnar nerve.  This would affect the pinky finger and the outside half of the ring finger but would not affect the index finger.  The nerve conduction study does not explain your index finger symptoms.  Recommend return to clinic to discuss the results of full detail and treatment plan and options going forward.

## 2021-10-08 ENCOUNTER — Ambulatory Visit: Payer: BC Managed Care – PPO | Admitting: Family Medicine

## 2021-10-08 NOTE — Progress Notes (Deleted)
° °  I, Peterson Lombard, LAT, ATC acting as a scribe for Lynne Leader, MD.  Thomas Koch is a 50 y.o. male who presents to [place] at [facility] today for L elbow pain and paresthesias into L hand w/ NCV study review. Pt was last seen by Dr. Eduard Clos on 08/14/21 and was taught HEP and was advised to wear a night splint. Today, pt reports   Dx testing: 09/25/21 NCV study  08/14/21 C-spine XR  03/28/13 L wrist XR  Pertinent review of systems: ***  Relevant historical information: ***   Exam:  There were no vitals taken for this visit. General: Well Developed, well nourished, and in no acute distress.   MSK: ***    Lab and Radiology Results No results found for this or any previous visit (from the past 72 hour(s)). No results found.     Assessment and Plan: 50 y.o. male with ***   PDMP not reviewed this encounter. No orders of the defined types were placed in this encounter.  No orders of the defined types were placed in this encounter.    Discussed warning signs or symptoms. Please see discharge instructions. Patient expresses understanding.   ***

## 2021-10-09 ENCOUNTER — Ambulatory Visit: Payer: Self-pay

## 2021-10-09 ENCOUNTER — Encounter: Payer: Self-pay | Admitting: Family Medicine

## 2021-10-09 ENCOUNTER — Other Ambulatory Visit: Payer: Self-pay

## 2021-10-09 ENCOUNTER — Ambulatory Visit: Payer: BC Managed Care – PPO | Admitting: Family Medicine

## 2021-10-09 VITALS — BP 122/78 | HR 78 | Ht 76.0 in | Wt 246.4 lb

## 2021-10-09 DIAGNOSIS — G5622 Lesion of ulnar nerve, left upper limb: Secondary | ICD-10-CM | POA: Insufficient documentation

## 2021-10-09 DIAGNOSIS — R200 Anesthesia of skin: Secondary | ICD-10-CM | POA: Diagnosis not present

## 2021-10-09 NOTE — Patient Instructions (Addendum)
Good to see you today.  You had a L cubital tunnel injection.  Call or go to the ER if you develop a large red swollen joint with extreme pain or oozing puss.    Look into getting a cubital tunnel brace to wear at night while you sleep.  You can purchase one on [place] like Dr. [name] discussed w/ you.  Follow-up: 6 weeks

## 2021-10-09 NOTE — Progress Notes (Signed)
I, Thomas Koch, LAT, ATC, am serving as scribe for Dr. Lynne Leader.  Thomas Koch is a 50 y.o. male who presents to [place] at [facility] today for f/u L elbow pain w/ paraesthesia into his L index and 5th finger and NCV study review. Pt was last seen by Dr. [name] on 08/14/21 and was taught a HEP and advised to wear a night splint and proceed to NCV study. Today, pt reports he con't to have numbness and tingling in his L index and pinky fingers w/ the L index finger being the worst of the two.  He has not been wearing a night splint.  He notes the index finger numbness occurred after he injured his hand weightlifting.  He denies any weakness into his hand especially in the index finger side of his hand.  Dx testing: 09/25/21 NCV study 08/14/21 C-spine XR  03/28/13 L wrist XR  Pertinent review of systems: No fevers or chills  Relevant historical information: Sleep apnea   Exam:  BP 122/78 (BP Location: Right Arm, Patient Position: Sitting, Cuff Size: Large)    Pulse 78    Ht 6' 4"  (1.93 m)    Wt 246 lb 6.4 oz (111.8 kg)    SpO2 97%    BMI 29.99 kg/m  General: Well Developed, well nourished, and in no acute distress.   MSK: Left elbow normal-appearing normal elbow motion and strength.  Positive Tinel's a cubital tunnel. Hand strength is intact. Decreased sensation to index finger especially on the dorsal and radial aspect of the index finger.    Lab and Radiology Results  Procedure: Real-time Ultrasound Guided hydrodissection of ulnar nerve at the cubital tunnel  Device: Philips Affiniti 50G Images permanently stored and available for review in PACS Verbal informed consent obtained.  Discussed risks and benefits of procedure. Warned about infection bleeding damage to structures skin hypopigmentation and fat atrophy among others. Patient expresses understanding and agreement Time-out conducted.   Noted no overlying erythema, induration, or other signs of local  infection.   Skin prepped in a sterile fashion.   Local anesthesia: Topical Ethyl chloride.   With sterile technique and under real time ultrasound guidance: 40 mg of Kenalog and 2 mL lidocaine injected into the cubital tunnel around ulnar nerve. Fluid seen entering the cubital tunnel.   Completed without difficulty   Pain immediately resolved suggesting accurate placement of the medication.   Advised to call if fevers/chills, erythema, induration, drainage, or persistent bleeding.   Images permanently stored and available for review in the ultrasound unit.  Impression: Technically successful ultrasound guided injection.    Nerve conduction study of the left upper extremity dated 09/25/21  This is a 50 year old man with left hand numbness and tingling.   NCV & EMG Findings: Extensive electrodiagnostic testing of the left upper extremity shows:  Left sensory response shows prolonged latency (3.7 ms).  Left median, radial, and mixed palmar sensory responses are within normal limits. Left ulnar motor response shows prolonged latency (3.4 ms) and decreased conduction velocity across the elbow (A Elbow-B Elbow, 48 m/s).  The left median motor response is within normal limits.   There is no evidence of active or chronic motor axonal loss changes affecting any of the tested muscles.  Motor unit configuration and recruitment pattern is within normal limits.   Impression: Left ulnar neuropathy with slowing across the elbow, demyelinating.  Overall, these findings are moderate in degree electrically. There is no evidence of carpal tunnel  syndrome or cervical radiculopathy affecting the left upper extremity.     ___________________________ Thomas Amber, DO   Assessment and Plan: 50 y.o. male with  Numbness into the left hand multifactorial.  Ulnar-sided hand numbness due to cubital tunnel syndrome.  Nerve conduction study rates this as moderate.  Discussed options.  Plan for cubital tunnel  injection/ulnar nerve hydrodissection.  Additionally we will proceed with night splinting.  Recheck in 6 weeks.  Index finger numbness.  This is less clear.  The nerve conduction study did not explain his index finger numbness however I am suspicious that the nerve conduction study was not sensitive enough for her distal enough to fully evaluate the index finger.  My suspicion is that he injured one of the digital nerves weightlifting previously.  We had a discussion that since he does not have any weakness and the numbness is not worsening and does not seem to be related to any other more significant or serious problem he would like to proceed with watchful waiting for now for the index finger numbness.   PDMP not reviewed this encounter. Orders Placed This Encounter  Procedures   [place] LIMITED JOINT SPACE STRUCTURES UP LEFT(NO LINKED CHARGES)    Order Specific Question:   Reason for Exam (SYMPTOM  OR DIAGNOSIS REQUIRED)    Answer:   L elbow pain    Order Specific Question:   Preferred imaging location?    Answer:   [place]   No orders of the defined types were placed in this encounter.    Discussed warning signs or symptoms. Please see discharge instructions. Patient expresses understanding.   The above documentation has been reviewed and is accurate and complete Lynne Leader, M.D.

## 2021-11-20 ENCOUNTER — Ambulatory Visit: Payer: BC Managed Care – PPO | Admitting: Family Medicine

## 2021-11-20 NOTE — Progress Notes (Deleted)
° °  I, Peterson Lombard, LAT, ATC acting as a scribe for Lynne Leader, MD.  Thomas Koch is a 51 y.o. male who presents to [place] at [facility] today for f/u L elbow pain and paresthesia due to cubital tunnel syndrome. Pt was last seen by Dr. [name] on 10/09/21 and was given a steroid injection at the L ulnar nerve at the cubital tunnel and was advised to use a night splint. Today, pt reports  Dx testing: 09/25/21 NCV study 08/14/21 C-spine XR             03/28/13 L wrist XR  Pertinent review of systems: ***  Relevant historical information: ***   Exam:  There were no vitals taken for this visit. General: Well Developed, well nourished, and in no acute distress.   MSK: ***    Lab and Radiology Results No results found for this or any previous visit (from the past 72 hour(s)). No results found.     Assessment and Plan: 51 y.o. male with ***   PDMP not reviewed this encounter. No orders of the defined types were placed in this encounter.  No orders of the defined types were placed in this encounter.    Discussed warning signs or symptoms. Please see discharge instructions. Patient expresses understanding.   ***

## 2022-03-04 DIAGNOSIS — Z85828 Personal history of other malignant neoplasm of skin: Secondary | ICD-10-CM | POA: Diagnosis not present

## 2022-03-04 DIAGNOSIS — Z85820 Personal history of malignant melanoma of skin: Secondary | ICD-10-CM | POA: Diagnosis not present

## 2022-03-04 DIAGNOSIS — D2261 Melanocytic nevi of right upper limb, including shoulder: Secondary | ICD-10-CM | POA: Diagnosis not present

## 2022-03-04 DIAGNOSIS — D1801 Hemangioma of skin and subcutaneous tissue: Secondary | ICD-10-CM | POA: Diagnosis not present

## 2022-04-09 ENCOUNTER — Ambulatory Visit: Payer: BC Managed Care – PPO | Admitting: Family Medicine

## 2022-07-07 ENCOUNTER — Encounter: Payer: Self-pay | Admitting: *Deleted

## 2022-08-08 ENCOUNTER — Other Ambulatory Visit: Payer: Self-pay | Admitting: Family Medicine

## 2022-08-11 ENCOUNTER — Telehealth: Payer: Self-pay | Admitting: Family Medicine

## 2022-08-11 NOTE — Telephone Encounter (Signed)
  LAST APPOINTMENT DATE:  Please schedule appointment if longer than 1 year 08/12/21  NEXT APPOINTMENT DATE: 08/19/22  MEDICATION: sildenafil (REVATIO) 20 MG tablet   Is the patient out of medication? Yes  PHARMACY: [id number] - [place] [address], [place] Phone: [phone]  Fax: [phone]      Patient requests to be called at ph# [phone] once RX has been sent to Pharmacy  Let patient know to contact pharmacy at the end of the day to make sure medication is ready.  Please notify patient to allow 48-72 hours to process

## 2022-08-13 NOTE — Telephone Encounter (Signed)
Patient requests to be called at ph# [phone] for status of RX request

## 2022-08-14 ENCOUNTER — Other Ambulatory Visit: Payer: Self-pay | Admitting: *Deleted

## 2022-08-14 MED ORDER — SILDENAFIL CITRATE 20 MG PO TABS: ORAL_TABLET | ORAL | 0 refills | Status: DC

## 2022-08-14 NOTE — Telephone Encounter (Signed)
Rx Sildenafil send to [facility] need OV for future refills

## 2022-08-15 ENCOUNTER — Encounter: Payer: BC Managed Care – PPO | Admitting: Family Medicine

## 2022-08-19 ENCOUNTER — Ambulatory Visit (INDEPENDENT_AMBULATORY_CARE_PROVIDER_SITE_OTHER): Payer: BC Managed Care – PPO | Admitting: Family Medicine

## 2022-08-19 ENCOUNTER — Encounter: Payer: Self-pay | Admitting: Family Medicine

## 2022-08-19 VITALS — BP 115/79 | HR 77 | Temp 98.4°F | Ht 76.0 in | Wt 240.0 lb

## 2022-08-19 DIAGNOSIS — Z114 Encounter for screening for human immunodeficiency virus [HIV]: Secondary | ICD-10-CM

## 2022-08-19 DIAGNOSIS — Z0001 Encounter for general adult medical examination with abnormal findings: Secondary | ICD-10-CM

## 2022-08-19 DIAGNOSIS — Z1159 Encounter for screening for other viral diseases: Secondary | ICD-10-CM | POA: Diagnosis not present

## 2022-08-19 DIAGNOSIS — N529 Male erectile dysfunction, unspecified: Secondary | ICD-10-CM | POA: Diagnosis not present

## 2022-08-19 DIAGNOSIS — Z125 Encounter for screening for malignant neoplasm of prostate: Secondary | ICD-10-CM

## 2022-08-19 LAB — COMPREHENSIVE METABOLIC PANEL
ALT: 27 U/L (ref 0–53)
AST: 24 U/L (ref 0–37)
Albumin: 4.2 g/dL (ref 3.5–5.2)
Alkaline Phosphatase: 93 U/L (ref 39–117)
BUN: 18 mg/dL (ref 6–23)
CO2: 25 mEq/L (ref 19–32)
Calcium: 9 mg/dL (ref 8.4–10.5)
Chloride: 105 mEq/L (ref 96–112)
Creatinine, Ser: 1.16 mg/dL (ref 0.40–1.50)
GFR: 72.45 mL/min (ref >60.00)
Glucose, Bld: 104 mg/dL — ABNORMAL HIGH (ref 70–99)
Potassium: 3.8 mEq/L (ref 3.5–5.1)
Sodium: 140 mEq/L (ref 135–145)
Total Bilirubin: 0.8 mg/dL (ref 0.2–1.2)
Total Protein: 6.8 g/dL (ref 6.0–8.3)

## 2022-08-19 LAB — LIPID PANEL
Cholesterol: 174 mg/dL (ref 0–200)
HDL: 60.4 mg/dL (ref >39.00)
LDL Cholesterol: 85 mg/dL (ref 0–99)
NonHDL: 113.96
Total CHOL/HDL Ratio: 3
Triglycerides: 143 mg/dL (ref 0.0–149.0)
VLDL: 28.6 mg/dL (ref 0.0–40.0)

## 2022-08-19 LAB — CBC
HCT: 44.6 % (ref 39.0–52.0)
Hemoglobin: 15.2 g/dL (ref 13.0–17.0)
MCHC: 34.1 g/dL (ref 30.0–36.0)
MCV: 92.1 fl (ref 78.0–100.0)
Platelets: 248 10*3/uL (ref 150.0–400.0)
RBC: 4.84 Mil/uL (ref 4.22–5.81)
RDW: 13.2 % (ref 11.5–15.5)
WBC: 7.4 10*3/uL (ref 4.0–10.5)

## 2022-08-19 LAB — PSA: PSA: 0.22 ng/mL (ref 0.10–4.00)

## 2022-08-19 LAB — TESTOSTERONE: Testosterone: 287.69 ng/dL — ABNORMAL LOW (ref 300.00–890.00)

## 2022-08-19 LAB — TSH: TSH: 0.8 u[IU]/mL (ref 0.35–5.50)

## 2022-08-19 MED ORDER — SILDENAFIL CITRATE 20 MG PO TABS: ORAL_TABLET | ORAL | 11 refills | Status: DC

## 2022-08-19 NOTE — Patient Instructions (Signed)
It was very nice to see you today!  We will check blood work today.  I will refill your medication.  Please continue to work on diet and exercise.  We will see you back in year for your next physical.  Come back sooner if needed.  Take care, Dr Jerline Pain  PLEASE NOTE:  If you had any lab tests please let us know if you have not heard back within a few days. You may see your results on mychart before we have a chance to review them but we will give you a call once they are reviewed by [place]. If we ordered any referrals today, please let us know if you have not heard from their office within the next week.   Please try these tips to maintain a healthy lifestyle:  Eat at least 3 REAL meals and 1-2 snacks per day.  Aim for no more than 5 hours between eating.  If you eat breakfast, please do so within one hour of getting up.   Each meal should contain half fruits/vegetables, one quarter protein, and one quarter carbs (no bigger than a computer mouse)  Cut down on sweet beverages. This includes juice, soda, and sweet tea.   Drink at least 1 glass of water with each meal and aim for at least 8 glasses per day  Exercise at least 150 minutes every week.    Preventive Care 88-30 Years Old, Male Preventive care refers to lifestyle choices and visits with your health care provider that can promote health and wellness. Preventive care visits are also called wellness exams. What can I expect for my preventive care visit? Counseling During your preventive care visit, your health care provider may ask about your: Medical history, including: Past medical problems. Family medical history. Current health, including: Emotional well-being. Home life and relationship well-being. Sexual activity. Lifestyle, including: Alcohol, nicotine or tobacco, and drug use. Access to firearms. Diet, exercise, and sleep habits. Safety issues such as seatbelt and bike helmet use. Sunscreen use. Work and work  Statistician. Physical exam Your health care provider will check your: Height and weight. These may be used to calculate your BMI (body mass index). BMI is a measurement that tells if you are at a healthy weight. Waist circumference. This measures the distance around your waistline. This measurement also tells if you are at a healthy weight and may help predict your risk of certain diseases, such as type 2 diabetes and high blood pressure. Heart rate and blood pressure. Body temperature. Skin for abnormal spots. What immunizations do I need?  Vaccines are usually given at various ages, according to a schedule. Your health care provider will recommend vaccines for you based on your age, medical history, and lifestyle or other factors, such as travel or where you work. What tests do I need? Screening Your health care provider may recommend screening tests for certain conditions. This may include: Lipid and cholesterol levels. Diabetes screening. This is done by checking your blood sugar (glucose) after you have not eaten for a while (fasting). Hepatitis B test. Hepatitis C test. HIV (human immunodeficiency virus) test. STI (sexually transmitted infection) testing, if you are at risk. Lung cancer screening. Prostate cancer screening. Colorectal cancer screening. Talk with your health care provider about your test results, treatment options, and if necessary, the need for more tests. Follow these instructions at home: Eating and drinking  Eat a diet that includes fresh fruits and vegetables, whole grains, lean protein, and low-fat dairy products. Take  vitamin and mineral supplements as recommended by your health care provider. Do not drink alcohol if your health care provider tells you not to drink. If you drink alcohol: Limit how much you have to 0-2 drinks a day. Know how much alcohol is in your drink. In the [place] one drink equals one 12 oz bottle of beer (355 mL), one 5 oz glass of  wine (148 mL), or one 1 oz glass of hard liquor (44 mL). Lifestyle Brush your teeth every morning and night with fluoride toothpaste. Floss one time each day. Exercise for at least 30 minutes 5 or more days each week. Do not use any products that contain nicotine or tobacco. These products include cigarettes, chewing tobacco, and vaping devices, such as e-cigarettes. If you need help quitting, ask your health care provider. Do not use drugs. If you are sexually active, practice safe sex. Use a condom or other form of protection to prevent STIs. Take aspirin only as told by your health care provider. Make sure that you understand how much to take and what form to take. Work with your health care provider to find out whether it is safe and beneficial for you to take aspirin daily. Find healthy ways to manage stress, such as: Meditation, yoga, or listening to music. Journaling. Talking to a trusted person. Spending time with friends and family. Minimize exposure to UV radiation to reduce your risk of skin cancer. Safety Always wear your seat belt while driving or riding in a vehicle. Do not drive: If you have been drinking alcohol. Do not ride with someone who has been drinking. When you are tired or distracted. While texting. If you have been using any mind-altering substances or drugs. Wear a helmet and other protective equipment during sports activities. If you have firearms in your house, make sure you follow all gun safety procedures. What's next? Go to your health care provider once a year for an annual wellness visit. Ask your health care provider how often you should have your eyes and teeth checked. Stay up to date on all vaccines. This information is not intended to replace advice given to you by your health care provider. Make sure you discuss any questions you have with your health care provider. Document Revised: 03/27/2021 Document Reviewed: 03/27/2021 Elsevier Patient  Education  [place].

## 2022-08-19 NOTE — Progress Notes (Signed)
Chief Complaint:  Thomas Koch is a 51 y.o. male who presents today for his annual comprehensive physical exam.    Assessment/Plan:  Chronic Problems Addressed Today: Erectile dysfunction Doing well on sildenafil.  No significant side effects.  Refill today.  Preventative Healthcare: Check labs.  Patient Counseling(The following topics were reviewed and/or handout was given):  -Nutrition: Stressed importance of moderation in sodium/caffeine intake, saturated fat and cholesterol, caloric balance, sufficient intake of fresh fruits, vegetables, and fiber.  -Stressed the importance of regular exercise.   -Substance Abuse: Discussed cessation/primary prevention of tobacco, alcohol, or other drug use; driving or other dangerous activities under the influence; availability of treatment for abuse.   -Injury prevention: Discussed safety belts, safety helmets, smoke detector, smoking near bedding or upholstery.   -Sexuality: Discussed sexually transmitted diseases, partner selection, use of condoms, avoidance of unintended pregnancy and contraceptive alternatives.   -Dental health: Discussed importance of regular tooth brushing, flossing, and dental visits.  -Health maintenance and immunizations reviewed. Please refer to Health maintenance section.  Return to care in 1 year for next preventative visit.     Subjective:  HPI:  Thomas Koch has no acute complaints today.   Lifestyle Diet: Balanced. Plenty of fruits and vegetables.  Exercise: Works out routinely.      08/19/2022    1:59 PM  Depression screen PHQ 2/9  Decreased Interest 0  Down, Depressed, Hopeless 0  PHQ - 2 Score 0    Health Maintenance Due  Topic Date Due   Hepatitis C Screening  Never done     ROS: Per HPI, otherwise a complete review of systems was negative.   PMH:  The following were reviewed and entered/updated in epic: Past Medical History:  Diagnosis Date   Lumbar back pain    Physical exam, annual     Patient Active Problem List   Diagnosis Date Noted   Cubital tunnel syndrome on left 10/09/2021   Erectile dysfunction 08/16/2019   Chronic back pain 08/16/2019   History of melanoma 62/83/6629   Eosinophilic esophagitis 47/65/4650   Seasonal allergic rhinitis 01/11/2018   Allergic conjunctivitis 01/11/2018   Food allergy 01/11/2018   Sleep apnea, unspecified 08/13/2017   Past Surgical History:  Procedure Laterality Date   melanona removed  08/2012   on his upper back    Family History  Problem Relation Age of Onset   COPD Thomas Koch    Hyperlipidemia Thomas Koch    Hypertension Thomas Koch    Osteoporosis Thomas Koch    Colon polyps Thomas Koch    Hyperlipidemia Thomas Koch    Heart attack Thomas Koch 15   Diabetes Thomas Koch    Heart disease Thomas Koch    Heart disease Thomas Koch     Medications- reviewed and updated Current Outpatient Medications  Medication Sig Dispense Refill   pantoprazole (PROTONIX) 40 MG tablet Take 1 tablet (40 mg total) by mouth daily. 90 tablet 3   sildenafil (REVATIO) 20 MG tablet TAKE 1-5 TABLETS BY MOUTH AS DIRECTED 30 tablet 11   No current facility-administered medications for this visit.    Allergies-reviewed and updated Allergies  Allergen Reactions   Caffeine     REACTION: causes hives, itching Pt states Thomas Koch can tolerate small amounts of caffeine 12/12/2014   Ibuprofen     REACTION: hives   Penicillamine    Penicillins     REACTION: rash    Social History   Socioeconomic History   Marital status: Married    Spouse name: Thomas Koch  Number of children: 1   Years of education: Not on file   Highest education level: Not on file  Occupational History   Occupation: office worker  Tobacco Use   Smoking status: Never   Smokeless tobacco: Never  Vaping Use   Vaping Use: Never used  Substance and Sexual Activity   Alcohol use: Yes    Alcohol/week: 0.0 standard drinks of alcohol    Comment: social use    Drug use: No   Sexual activity: Yes    Partners: Female  Other Topics Concern   Not on file  Social History Narrative   Not on file   Social Determinants of Health   Financial Resource Strain: Not on file  Food Insecurity: Not on file  Transportation Needs: Not on file  Physical Activity: Not on file  Stress: Not on file  Social Connections: Not on file        Objective:  Physical Exam: BP 115/79   Pulse 77   Temp 98.4 F (36.9 C) (Temporal)   Ht '6\' 4"'$  (1.93 Koch)   Wt 240 lb (108.9 kg)   SpO2 97%   BMI 29.21 kg/Koch   Body mass index is 29.21 kg/Koch. Wt Readings from Last 3 Encounters:  08/19/22 240 lb (108.9 kg)  10/09/21 246 lb 6.4 oz (111.8 kg)  08/14/21 239 lb 9.6 oz (108.7 kg)   Gen: NAD, resting comfortably HEENT: TMs normal bilaterally. OP clear. No thyromegaly noted.  CV: RRR with no murmurs appreciated Pulm: NWOB, CTAB with no crackles, wheezes, or rhonchi GI: Normal bowel sounds present. Soft, Nontender, Nondistended. MSK: no edema, cyanosis, or clubbing noted Skin: warm, dry Neuro: CN2-12 grossly intact. Strength 5/5 in upper and lower extremities. Reflexes symmetric and intact bilaterally.  Psych: Normal affect and thought content     Thomas Koch. Jerline Pain, MD 08/19/2022 2:40 PM

## 2022-08-19 NOTE — Assessment & Plan Note (Signed)
Doing well on sildenafil.  No significant side effects.  Refill today.

## 2022-08-20 LAB — HIV ANTIBODY (ROUTINE TESTING W REFLEX): HIV 1&2 Ab, 4th Generation: NONREACTIVE

## 2022-08-20 LAB — HEPATITIS C ANTIBODY: Hepatitis C Ab: NONREACTIVE

## 2022-08-21 ENCOUNTER — Encounter: Payer: Self-pay | Admitting: Family Medicine

## 2022-08-21 ENCOUNTER — Other Ambulatory Visit: Payer: Self-pay | Admitting: *Deleted

## 2022-08-21 DIAGNOSIS — R7989 Other specified abnormal findings of blood chemistry: Secondary | ICD-10-CM

## 2022-08-21 DIAGNOSIS — R739 Hyperglycemia, unspecified: Secondary | ICD-10-CM | POA: Insufficient documentation

## 2022-08-21 NOTE — Progress Notes (Signed)
Please inform patient of the following:  Testosterone borderline low.  Recommend he come back for repeat test if he is interested in replacement.  His sugar is a little high but the rest of his test are normal.  Do not need to make any other medication changes at this point.  He should continue to work on diet and exercise and we can recheck in a year.

## 2022-08-22 NOTE — Telephone Encounter (Signed)
Rx was send to pharmacy  

## 2022-08-22 NOTE — Telephone Encounter (Addendum)
Patient requests RX for sildenafil (REVATIO) 20 MG tablet   be resent as 90 tablets with 11 refills to PHARMACY: [facility] [id number] - [place], [place] - [address] Phone: [phone]  Fax: [phone]

## 2022-08-28 ENCOUNTER — Other Ambulatory Visit (INDEPENDENT_AMBULATORY_CARE_PROVIDER_SITE_OTHER): Payer: BC Managed Care – PPO

## 2022-08-28 DIAGNOSIS — R7989 Other specified abnormal findings of blood chemistry: Secondary | ICD-10-CM

## 2022-08-28 LAB — TESTOSTERONE: Testosterone: 277.33 ng/dL — ABNORMAL LOW (ref 300.00–890.00)

## 2022-08-29 NOTE — Progress Notes (Signed)
Please inform patient of the following:  Testosterone confirmed to be low again.  Recommend referral to urology to discuss replacement if he is interested.

## 2022-09-01 ENCOUNTER — Other Ambulatory Visit: Payer: Self-pay | Admitting: *Deleted

## 2022-09-01 DIAGNOSIS — E349 Endocrine disorder, unspecified: Secondary | ICD-10-CM

## 2022-09-19 DIAGNOSIS — E291 Testicular hypofunction: Secondary | ICD-10-CM | POA: Diagnosis not present

## 2022-09-20 ENCOUNTER — Other Ambulatory Visit: Payer: Self-pay | Admitting: Family Medicine

## 2022-09-24 DIAGNOSIS — D2261 Melanocytic nevi of right upper limb, including shoulder: Secondary | ICD-10-CM | POA: Diagnosis not present

## 2022-09-24 DIAGNOSIS — Z85820 Personal history of malignant melanoma of skin: Secondary | ICD-10-CM | POA: Diagnosis not present

## 2022-09-24 DIAGNOSIS — Z85828 Personal history of other malignant neoplasm of skin: Secondary | ICD-10-CM | POA: Diagnosis not present

## 2022-09-24 DIAGNOSIS — L814 Other melanin hyperpigmentation: Secondary | ICD-10-CM | POA: Diagnosis not present

## 2022-10-24 DIAGNOSIS — M7542 Impingement syndrome of left shoulder: Secondary | ICD-10-CM | POA: Diagnosis not present

## 2022-10-24 DIAGNOSIS — M25512 Pain in left shoulder: Secondary | ICD-10-CM | POA: Diagnosis not present

## 2022-11-17 DIAGNOSIS — E291 Testicular hypofunction: Secondary | ICD-10-CM | POA: Diagnosis not present

## 2022-11-17 DIAGNOSIS — Z125 Encounter for screening for malignant neoplasm of prostate: Secondary | ICD-10-CM | POA: Diagnosis not present

## 2022-11-17 LAB — TESTOSTERONE: Testosterone: 461.7

## 2022-11-17 LAB — PSA: PSA: 0.24

## 2023-01-13 DIAGNOSIS — M7542 Impingement syndrome of left shoulder: Secondary | ICD-10-CM | POA: Diagnosis not present

## 2023-01-22 DIAGNOSIS — E291 Testicular hypofunction: Secondary | ICD-10-CM | POA: Diagnosis not present

## 2023-01-26 ENCOUNTER — Encounter: Payer: Self-pay | Admitting: Family Medicine

## 2023-03-06 DIAGNOSIS — M5451 Vertebrogenic low back pain: Secondary | ICD-10-CM | POA: Diagnosis not present

## 2023-03-06 DIAGNOSIS — M5136 Other intervertebral disc degeneration, lumbar region: Secondary | ICD-10-CM | POA: Diagnosis not present

## 2023-03-06 DIAGNOSIS — M25512 Pain in left shoulder: Secondary | ICD-10-CM | POA: Diagnosis not present

## 2023-03-11 DIAGNOSIS — Z85820 Personal history of malignant melanoma of skin: Secondary | ICD-10-CM | POA: Diagnosis not present

## 2023-03-11 DIAGNOSIS — D2262 Melanocytic nevi of left upper limb, including shoulder: Secondary | ICD-10-CM | POA: Diagnosis not present

## 2023-03-11 DIAGNOSIS — D2261 Melanocytic nevi of right upper limb, including shoulder: Secondary | ICD-10-CM | POA: Diagnosis not present

## 2023-03-11 DIAGNOSIS — Z85828 Personal history of other malignant neoplasm of skin: Secondary | ICD-10-CM | POA: Diagnosis not present

## 2023-03-30 DIAGNOSIS — M25512 Pain in left shoulder: Secondary | ICD-10-CM | POA: Diagnosis not present

## 2023-04-06 DIAGNOSIS — M25512 Pain in left shoulder: Secondary | ICD-10-CM | POA: Diagnosis not present

## 2023-04-07 DIAGNOSIS — R0681 Apnea, not elsewhere classified: Secondary | ICD-10-CM | POA: Diagnosis not present

## 2023-04-08 DIAGNOSIS — R0681 Apnea, not elsewhere classified: Secondary | ICD-10-CM | POA: Diagnosis not present

## 2023-04-15 ENCOUNTER — Other Ambulatory Visit: Payer: Self-pay | Admitting: Family Medicine

## 2023-04-15 NOTE — Telephone Encounter (Addendum)
RX sent incorrectly. Should be 90 tablets each month with 11 refills.

## 2023-04-17 NOTE — Telephone Encounter (Signed)
Please advise 

## 2023-04-20 NOTE — Telephone Encounter (Signed)
Rx send by PCP. 

## 2023-04-20 NOTE — Telephone Encounter (Signed)
Please send in as 90 tablets with 11 refills.  Thomas Koch. Jimmey Ralph, MD 04/20/2023 7:32 AM

## 2023-04-27 DIAGNOSIS — M542 Cervicalgia: Secondary | ICD-10-CM | POA: Diagnosis not present

## 2023-05-07 DIAGNOSIS — M542 Cervicalgia: Secondary | ICD-10-CM | POA: Diagnosis not present

## 2023-05-12 DIAGNOSIS — M542 Cervicalgia: Secondary | ICD-10-CM | POA: Diagnosis not present

## 2023-05-19 DIAGNOSIS — M542 Cervicalgia: Secondary | ICD-10-CM | POA: Diagnosis not present

## 2023-07-17 ENCOUNTER — Other Ambulatory Visit: Payer: Self-pay | Admitting: Family Medicine

## 2023-07-17 DIAGNOSIS — Z1211 Encounter for screening for malignant neoplasm of colon: Secondary | ICD-10-CM

## 2023-07-17 DIAGNOSIS — Z1212 Encounter for screening for malignant neoplasm of rectum: Secondary | ICD-10-CM

## 2023-08-21 ENCOUNTER — Ambulatory Visit (INDEPENDENT_AMBULATORY_CARE_PROVIDER_SITE_OTHER): Payer: BC Managed Care – PPO | Admitting: Family Medicine

## 2023-08-21 ENCOUNTER — Encounter: Payer: Self-pay | Admitting: Family Medicine

## 2023-08-21 VITALS — BP 113/78 | HR 76 | Temp 97.5°F | Ht 76.0 in | Wt 251.8 lb

## 2023-08-21 DIAGNOSIS — R739 Hyperglycemia, unspecified: Secondary | ICD-10-CM | POA: Diagnosis not present

## 2023-08-21 DIAGNOSIS — R7989 Other specified abnormal findings of blood chemistry: Secondary | ICD-10-CM | POA: Diagnosis not present

## 2023-08-21 DIAGNOSIS — N529 Male erectile dysfunction, unspecified: Secondary | ICD-10-CM

## 2023-08-21 DIAGNOSIS — Z125 Encounter for screening for malignant neoplasm of prostate: Secondary | ICD-10-CM | POA: Diagnosis not present

## 2023-08-21 DIAGNOSIS — Z0001 Encounter for general adult medical examination with abnormal findings: Secondary | ICD-10-CM | POA: Diagnosis not present

## 2023-08-21 DIAGNOSIS — Z1211 Encounter for screening for malignant neoplasm of colon: Secondary | ICD-10-CM

## 2023-08-21 DIAGNOSIS — M5412 Radiculopathy, cervical region: Secondary | ICD-10-CM | POA: Diagnosis not present

## 2023-08-21 DIAGNOSIS — Z0000 Encounter for general adult medical examination without abnormal findings: Secondary | ICD-10-CM | POA: Diagnosis not present

## 2023-08-21 DIAGNOSIS — Z23 Encounter for immunization: Secondary | ICD-10-CM | POA: Diagnosis not present

## 2023-08-21 DIAGNOSIS — K200 Eosinophilic esophagitis: Secondary | ICD-10-CM

## 2023-08-21 NOTE — Assessment & Plan Note (Signed)
Stable on Protonix 40 mg daily. 

## 2023-08-21 NOTE — Addendum Note (Signed)
Addended by: Dyann Kief on: 08/21/2023 03:22 PM   Modules accepted: Orders

## 2023-08-21 NOTE — Assessment & Plan Note (Signed)
On testosterone replacement per urology.  Will check labs today.

## 2023-08-21 NOTE — Assessment & Plan Note (Signed)
Check A1c. 

## 2023-08-21 NOTE — Progress Notes (Signed)
Chief Complaint:  Thomas Koch is a 52 y.o. male who presents today for his annual comprehensive physical exam.    Assessment/Plan:  Chronic Problems Addressed Today: Hyperglycemia Check A1c.   Cervical radiculopathy Continue management per orthopedics.  Eosinophilic esophagitis Stable on Protonix 40 mg daily.  Low testosterone On testosterone replacement per urology.  Will check labs today.  Erectile dysfunction Stable on sildenafil as needed.  Tolerating well.  Does not need refill today.  Preventative Healthcare: Check labs.  Shingles vaccine given today.  Will place referral for colonoscopy.  Patient Counseling(The following topics were reviewed and/or handout was given):  -Nutrition: Stressed importance of moderation in sodium/caffeine intake, saturated fat and cholesterol, caloric balance, sufficient intake of fresh fruits, vegetables, and fiber.  -Stressed the importance of regular exercise.   -Substance Abuse: Discussed cessation/primary prevention of tobacco, alcohol, or other drug use; driving or other dangerous activities under the influence; availability of treatment for abuse.   -Injury prevention: Discussed safety belts, safety helmets, smoke detector, smoking near bedding or upholstery.   -Sexuality: Discussed sexually transmitted diseases, partner selection, use of condoms, avoidance of unintended pregnancy and contraceptive alternatives.   -Dental health: Discussed importance of regular tooth brushing, flossing, and dental visits.  -Health maintenance and immunizations reviewed. Please refer to Health maintenance section.  Return to care in 1 year for next preventative visit.     Subjective:  HPI:  He has no acute complaints today. Since our last visit he has been following with Dr Shelle Iron with cervical radiculopathy.  They are potentially considering surgery.  Symptoms are overall stable.  He has lost quite a bit of strength in his left upper  extremity.  Lifestyle Diet: Balanced. Trying to get more fruits and vegetables.  Exercise: Limited due recent orthopedic issues.      08/19/2022    1:59 PM  Depression screen PHQ 2/9  Decreased Interest 0  Down, Depressed, Hopeless 0  PHQ - 2 Score 0    Health Maintenance Due  Topic Date Due   COVID-19 Vaccine  Never done   Colonoscopy  Never done   Zoster Vaccines- Shingrix (1 of 2) Never done     ROS: Per HPI, otherwise a complete review of systems was negative.   PMH:  The following were reviewed and entered/updated in epic: Past Medical History:  Diagnosis Date   Lumbar back pain    Physical exam, annual    Patient Active Problem List   Diagnosis Date Noted   Cervical radiculopathy 08/21/2023   Low testosterone 08/21/2023   Hyperglycemia 08/21/2022   Cubital tunnel syndrome on left 10/09/2021   Erectile dysfunction 08/16/2019   Chronic back pain 08/16/2019   History of melanoma 08/16/2019   Eosinophilic esophagitis 01/11/2018   Seasonal allergic rhinitis 01/11/2018   Allergic conjunctivitis 01/11/2018   Food allergy 01/11/2018   Sleep apnea, unspecified 08/13/2017   Past Surgical History:  Procedure Laterality Date   melanona removed  08/2012   on his upper back    Family History  Problem Relation Age of Onset   COPD Father    Hyperlipidemia Father    Hypertension Father    Osteoporosis Mother    Colon polyps Mother    Hyperlipidemia Mother    Heart attack Maternal Grandfather 18   Diabetes Paternal Grandmother    Heart disease Paternal Grandmother    Heart disease Paternal Grandfather     Medications- reviewed and updated Current Outpatient Medications  Medication Sig Dispense Refill  pantoprazole (PROTONIX) 40 MG tablet TAKE ONE TABLET BY MOUTH DAILY 90 tablet 3   sildenafil (REVATIO) 20 MG tablet TAKE 1-5 TABLETS BY MOUTH DAILY AS INSTRUCTED 90 tablet 11   No current facility-administered medications for this visit.     Allergies-reviewed and updated Allergies  Allergen Reactions   Caffeine     REACTION: causes hives, itching Pt states he can tolerate small amounts of caffeine 12/12/2014   Ibuprofen     REACTION: hives   Penicillamine    Penicillins     REACTION: rash    Social History   Socioeconomic History   Marital status: Married    Spouse name: Braden Furlong   Number of children: 1   Years of education: Not on file   Highest education level: Not on file  Occupational History   Occupation: Film/video editor  Tobacco Use   Smoking status: Never   Smokeless tobacco: Never  Vaping Use   Vaping status: Never Used  Substance and Sexual Activity   Alcohol use: Yes    Alcohol/week: 0.0 standard drinks of alcohol    Comment: social use   Drug use: No   Sexual activity: Yes    Partners: Female  Other Topics Concern   Not on file  Social History Narrative   Not on file   Social Determinants of Health   Financial Resource Strain: Not on file  Food Insecurity: Not on file  Transportation Needs: Not on file  Physical Activity: Not on file  Stress: Not on file  Social Connections: Not on file        Objective:  Physical Exam: BP 113/78 (BP Location: Left Arm)   Pulse 76   Temp (!) 97.5 F (36.4 C) (Temporal)   Ht 6\' 4"  (1.93 m)   Wt 251 lb 12.8 oz (114.2 kg)   SpO2 97%   BMI 30.65 kg/m   Body mass index is 30.65 kg/m. Wt Readings from Last 3 Encounters:  08/21/23 251 lb 12.8 oz (114.2 kg)  08/19/22 240 lb (108.9 kg)  10/09/21 246 lb 6.4 oz (111.8 kg)   Gen: NAD, resting comfortably HEENT: TMs normal bilaterally. OP clear. No thyromegaly noted.  CV: RRR with no murmurs appreciated Pulm: NWOB, CTAB with no crackles, wheezes, or rhonchi GI: Normal bowel sounds present. Soft, Nontender, Nondistended. MSK: no edema, cyanosis, or clubbing noted Skin: warm, dry Neuro: CN2-12 grossly intact. Strength 5/5 in upper and lower extremities. Reflexes symmetric and intact  bilaterally.  Psych: Normal affect and thought content     Estalene Bergey M. Jimmey Ralph, MD 08/21/2023 2:35 PM

## 2023-08-21 NOTE — Patient Instructions (Signed)
It was very nice to see you today!  We will check blood work today.  We will give your shingles vaccine today.  I will refer you for colonoscopy.  Please continue to work on diet and exercise..  I will see back in year for your next physical.  Come back to see [place] sooner if needed.  Return in about 1 year (around 08/20/2024) for Annual Physical.   Take care, Dr Jimmey Ralph  PLEASE NOTE:  If you had any lab tests, please let us know if you have not heard back within a few days. You may see your results on mychart before we have a chance to review them but we will give you a call once they are reviewed by [place].   If we ordered any referrals today, please let us know if you have not heard from their office within the next week.   If you had any urgent prescriptions sent in today, please check with the pharmacy within an hour of our visit to make sure the prescription was transmitted appropriately.   Please try these tips to maintain a healthy lifestyle:  Eat at least 3 REAL meals and 1-2 snacks per day.  Aim for no more than 5 hours between eating.  If you eat breakfast, please do so within one hour of getting up.   Each meal should contain half fruits/vegetables, one quarter protein, and one quarter carbs (no bigger than a computer mouse)  Cut down on sweet beverages. This includes juice, soda, and sweet tea.   Drink at least 1 glass of water with each meal and aim for at least 8 glasses per day  Exercise at least 150 minutes every week.

## 2023-08-21 NOTE — Assessment & Plan Note (Signed)
Stable on sildenafil as needed.  Tolerating well.  Does not need refill today.

## 2023-08-21 NOTE — Addendum Note (Signed)
Addended by: Lorn Junes on: 08/21/2023 03:24 PM   Modules accepted: Orders

## 2023-08-21 NOTE — Assessment & Plan Note (Signed)
Continue management per orthopedics. 

## 2023-08-22 LAB — HEMOGLOBIN A1C
Hgb A1c MFr Bld: 5.8 %{Hb} — ABNORMAL HIGH (ref <5.7)
Mean Plasma Glucose: 120 mg/dL
eAG (mmol/L): 6.6 mmol/L

## 2023-08-22 LAB — CBC
HCT: 46.5 % (ref 38.5–50.0)
Hemoglobin: 15.8 g/dL (ref 13.2–17.1)
MCH: 31.3 pg (ref 27.0–33.0)
MCHC: 34 g/dL (ref 32.0–36.0)
MCV: 92.1 fL (ref 80.0–100.0)
MPV: 8.5 fL (ref 7.5–12.5)
Platelets: 246 10*3/uL (ref 140–400)
RBC: 5.05 10*6/uL (ref 4.20–5.80)
RDW: 12.6 % (ref 11.0–15.0)
WBC: 7.7 10*3/uL (ref 3.8–10.8)

## 2023-08-22 LAB — COMPREHENSIVE METABOLIC PANEL
AG Ratio: 1.7 (calc) (ref 1.0–2.5)
ALT: 26 U/L (ref 9–46)
AST: 20 U/L (ref 10–35)
Albumin: 4.2 g/dL (ref 3.6–5.1)
Alkaline phosphatase (APISO): 66 U/L (ref 35–144)
BUN: 14 mg/dL (ref 7–25)
CO2: 23 mmol/L (ref 20–32)
Calcium: 9.1 mg/dL (ref 8.6–10.3)
Chloride: 107 mmol/L (ref 98–110)
Creat: 1.08 mg/dL (ref 0.70–1.30)
Globulin: 2.5 g/dL (ref 1.9–3.7)
Glucose, Bld: 86 mg/dL (ref 65–99)
Potassium: 4.1 mmol/L (ref 3.5–5.3)
Sodium: 142 mmol/L (ref 135–146)
Total Bilirubin: 0.8 mg/dL (ref 0.2–1.2)
Total Protein: 6.7 g/dL (ref 6.1–8.1)

## 2023-08-22 LAB — TESTOSTERONE: Testosterone: 385 ng/dL (ref 250–827)

## 2023-08-22 LAB — PSA: PSA: 0.32 ng/mL (ref <=4.00)

## 2023-08-22 LAB — LIPID PANEL
Cholesterol: 169 mg/dL (ref <200)
HDL: 56 mg/dL (ref >=40)
LDL Cholesterol (Calc): 94 mg/dL
Non-HDL Cholesterol (Calc): 113 mg/dL (ref <130)
Total CHOL/HDL Ratio: 3 (calc) (ref <5.0)
Triglycerides: 96 mg/dL (ref <150)

## 2023-08-22 LAB — TSH: TSH: 1.08 m[IU]/L (ref 0.40–4.50)

## 2023-08-25 NOTE — Progress Notes (Signed)
His A1c is mildly elevated but everything else is at goal.  Do not need to make any changes to his treatment plan.  He should continue to work on diet and exercise and we can recheck everything in a year.

## 2023-08-31 DIAGNOSIS — G4733 Obstructive sleep apnea (adult) (pediatric): Secondary | ICD-10-CM | POA: Diagnosis not present

## 2023-09-15 DIAGNOSIS — G4733 Obstructive sleep apnea (adult) (pediatric): Secondary | ICD-10-CM | POA: Diagnosis not present

## 2023-10-08 ENCOUNTER — Other Ambulatory Visit: Payer: Self-pay | Admitting: Family Medicine

## 2023-10-16 DIAGNOSIS — G4733 Obstructive sleep apnea (adult) (pediatric): Secondary | ICD-10-CM | POA: Diagnosis not present

## 2023-10-20 ENCOUNTER — Encounter: Payer: Self-pay | Admitting: Gastroenterology

## 2023-10-21 ENCOUNTER — Ambulatory Visit: Payer: BC Managed Care – PPO

## 2023-10-26 DIAGNOSIS — G4733 Obstructive sleep apnea (adult) (pediatric): Secondary | ICD-10-CM | POA: Diagnosis not present

## 2023-11-16 DIAGNOSIS — G4733 Obstructive sleep apnea (adult) (pediatric): Secondary | ICD-10-CM | POA: Diagnosis not present

## 2023-11-30 ENCOUNTER — Telehealth: Payer: Self-pay

## 2023-11-30 NOTE — Telephone Encounter (Signed)
Called patient and left message regarding pre visit appointment at 12:30

## 2023-12-07 ENCOUNTER — Ambulatory Visit (AMBULATORY_SURGERY_CENTER): Payer: BC Managed Care – PPO

## 2023-12-07 VITALS — Ht 76.0 in | Wt 240.0 lb

## 2023-12-07 DIAGNOSIS — Z1211 Encounter for screening for malignant neoplasm of colon: Secondary | ICD-10-CM

## 2023-12-07 MED ORDER — NA SULFATE-K SULFATE-MG SULF 17.5-3.13-1.6 GM/177ML PO SOLN: 1.0000 | Freq: Once | ORAL | 0 refills | Status: AC

## 2023-12-07 NOTE — Progress Notes (Signed)

## 2023-12-14 ENCOUNTER — Encounter: Payer: Self-pay | Admitting: Certified Registered Nurse Anesthetist

## 2023-12-14 DIAGNOSIS — G4733 Obstructive sleep apnea (adult) (pediatric): Secondary | ICD-10-CM | POA: Diagnosis not present

## 2023-12-15 ENCOUNTER — Encounter: Payer: Self-pay | Admitting: Gastroenterology

## 2023-12-16 ENCOUNTER — Ambulatory Visit (AMBULATORY_SURGERY_CENTER): Payer: BC Managed Care – PPO | Admitting: Gastroenterology

## 2023-12-16 ENCOUNTER — Encounter: Payer: Self-pay | Admitting: Gastroenterology

## 2023-12-16 VITALS — BP 112/86 | HR 67 | Temp 98.4°F | Resp 19 | Ht 76.0 in | Wt 240.0 lb

## 2023-12-16 DIAGNOSIS — D123 Benign neoplasm of transverse colon: Secondary | ICD-10-CM | POA: Diagnosis not present

## 2023-12-16 DIAGNOSIS — Z1211 Encounter for screening for malignant neoplasm of colon: Secondary | ICD-10-CM | POA: Diagnosis not present

## 2023-12-16 DIAGNOSIS — K635 Polyp of colon: Secondary | ICD-10-CM | POA: Diagnosis not present

## 2023-12-16 MED ORDER — SODIUM CHLORIDE 0.9 % IV SOLN: 500.0000 mL | Freq: Once | INTRAVENOUS | Status: DC

## 2023-12-16 NOTE — Progress Notes (Signed)
 Pt's states no medical or surgical changes since previsit or office visit.

## 2023-12-16 NOTE — Patient Instructions (Signed)
 Please read handouts provided. Continue present medications. Await pathology results. Resume previous diet. Repeat colonoscopy in 3 years for screening.   YOU HAD AN ENDOSCOPIC PROCEDURE TODAY AT THE [facility]:   Refer to the procedure report that was given to you for any specific questions about what was found during the examination.  If the procedure report does not answer your questions, please call your gastroenterologist to clarify.  If you requested that your care partner not be given the details of your procedure findings, then the procedure report has been included in a sealed envelope for you to review at your convenience later.  YOU SHOULD EXPECT: Some feelings of bloating in the abdomen. Passage of more gas than usual.  Walking can help get rid of the air that was put into your GI tract during the procedure and reduce the bloating. If you had a lower endoscopy (such as a colonoscopy or flexible sigmoidoscopy) you may notice spotting of blood in your stool or on the toilet paper. If you underwent a bowel prep for your procedure, you may not have a normal bowel movement for a few days.  Please Note:  You might notice some irritation and congestion in your nose or some drainage.  This is from the oxygen used during your procedure.  There is no need for concern and it should clear up in a day or so.  SYMPTOMS TO REPORT IMMEDIATELY:  Following lower endoscopy (colonoscopy or flexible sigmoidoscopy):  Excessive amounts of blood in the stool  Significant tenderness or worsening of abdominal pains  Swelling of the abdomen that is new, acute  Fever of 100F or higher.  For urgent or emergent issues, a gastroenterologist can be reached at any hour by calling [phone]. Do not use MyChart messaging for urgent concerns.    DIET:  We do recommend a small meal at first, but then you may proceed to your regular diet.  Drink plenty of fluids but you should avoid alcoholic  beverages for 24 hours.  ACTIVITY:  You should plan to take it easy for the rest of today and you should NOT DRIVE or use heavy machinery until tomorrow (because of the sedation medicines used during the test).    FOLLOW UP: Our staff will call the number listed on your records the next business day following your procedure.  We will call around 7:15- 8:00 am to check on you and address any questions or concerns that you may have regarding the information given to you following your procedure. If we do not reach you, we will leave a message.     If any biopsies were taken you will be contacted by phone or by letter within the next 1-3 weeks.  Please call us at [phone] if you have not heard about the biopsies in 3 weeks.    SIGNATURES/CONFIDENTIALITY: You and/or your care partner have signed paperwork which will be entered into your electronic medical record.  These signatures attest to the fact that that the information above on your After Visit Summary has been reviewed and is understood.  Full responsibility of the confidentiality of this discharge information lies with you and/or your care-partner.

## 2023-12-16 NOTE — Progress Notes (Signed)
 Called to room to assist during endoscopic procedure.  Patient ID and intended procedure confirmed with present staff. Received instructions for my participation in the procedure from the performing physician.

## 2023-12-16 NOTE — Op Note (Signed)
 [facility] Patient Name: Thomas Koch Procedure Date: 12/16/2023 7:51 AM MRN: [id number] Endoscopist: Sherilyn Cooter L. Myrtie Neither , MD, [id number] Age: 53 Referring MD:  Date of Birth: [birth year] Gender: Male Account #: [id number] Procedure:                Colonoscopy Indications:              Screening for colorectal malignant neoplasm, This                            is the patient's first colonoscopy Medicines:                Monitored Anesthesia Care Procedure:                Pre-Anesthesia Assessment:                           - Prior to the procedure, a History and Physical                            was performed, and patient medications and                            allergies were reviewed. The patient's tolerance of                            previous anesthesia was also reviewed. The risks                            and benefits of the procedure and the sedation                            options and risks were discussed with the patient.                            All questions were answered, and informed consent                            was obtained. Prior Anticoagulants: The patient has                            taken no anticoagulant or antiplatelet agents. ASA                            Grade Assessment: II - A patient with mild systemic                            disease. After reviewing the risks and benefits,                            the patient was deemed in satisfactory condition to                            undergo the procedure.  After obtaining informed consent, the colonoscope                            was passed under direct vision. Throughout the                            procedure, the patient's blood pressure, pulse, and                            oxygen saturations were monitored continuously. The                            CF [id number] #[id number] was introduced through the anus                            and advanced to the the  terminal ileum, with                            identification of the appendiceal orifice and IC                            valve. The colonoscopy was performed without                            difficulty. The patient tolerated the procedure                            well. The quality of the bowel preparation was fair                            in some areas despite lavage of opaque liquid and                            debris. The ileocecal valve, appendiceal orifice,                            and rectum were photographed. Scope In: 8:09:21 AM Scope Out: 8:29:09 AM Scope Withdrawal Time: 0 hours 16 minutes 53 seconds  Total Procedure Duration: 0 hours 19 minutes 48 seconds  Findings:                 The perianal and digital rectal examinations were                            normal.                           The terminal ileum appeared normal.                           Repeat examination of right colon under NBI                            performed.  A 4 mm polyp was found in the ascending colon. The                            polyp was semi-sessile. The polyp was removed with                            a cold snare. Resection was complete, but the polyp                            tissue was not retrieved.                           Three sessile polyps were found in the transverse                            colon. The polyps were 4 to 6 mm in size. These                            polyps were removed with a cold snare. Resection                            and retrieval were complete.                           The exam was otherwise without abnormality on                            direct and retroflexion views. Complications:            No immediate complications. Estimated Blood Loss:     Estimated blood loss was minimal. Impression:               - Preparation of the colon was fair.                           - The examined portion of the ileum was  normal.                           - One 4 mm polyp in the ascending colon, removed                            with a cold snare. Complete resection. Polyp tissue                            not retrieved.                           - Three 4 to 6 mm polyps in the transverse colon,                            removed with a cold snare. Resected and retrieved.                           - The  examination was otherwise normal on direct                            and retroflexion views. Recommendation:           - Patient has a contact number available for                            emergencies. The signs and symptoms of potential                            delayed complications were discussed with the                            patient. Return to normal activities tomorrow.                            Written discharge instructions were provided to the                            patient.                           - Resume previous diet.                           - Continue present medications.                           - Await pathology results.                           - Repeat colonoscopy in 3 years for surveillance.                            Consume more water with prep for next exam (see                            prep details above) Daniah Zaldivar L. Myrtie Neither, MD 12/16/2023 8:33:44 AM This report has been signed electronically.

## 2023-12-16 NOTE — Progress Notes (Signed)
 Report given to PACU, vss

## 2023-12-16 NOTE — Progress Notes (Signed)
 History and Physical:  This patient presents for endoscopic testing for: Encounter Diagnosis  Name Primary?   Special screening for malignant neoplasms, colon Yes    Average risk for colorectal cancer.  1st screening exam.  Patient denies chronic abdominal pain, rectal bleeding, constipation or diarrhea.  He reports that the GERD that he saw me for in 2018 is much improved. Takes PPI  1 - 2 times a week for heartburn, engages in diet and lifestyle measures. No dysphagia.  Patient is otherwise without complaints or active issues today.   Past Medical History: Past Medical History:  Diagnosis Date   Lumbar back pain    Physical exam, annual    Sleep apnea      Past Surgical History: Past Surgical History:  Procedure Laterality Date   melanona removed  08/2012   on his upper back    Allergies: Allergies  Allergen Reactions   Penicillamine Rash   Penicillins Rash    Outpatient Meds: Current Outpatient Medications  Medication Sig Dispense Refill   Aspirin-Acetaminophen-Caffeine (GOODY HEADACHE PO) Take 1 packet by mouth every 8 (eight) hours as needed.     ibuprofen (ADVIL) 200 MG tablet Take 200 mg by mouth every 6 (six) hours as needed.     pantoprazole (PROTONIX) 40 MG tablet TAKE 1 TABLET BY MOUTH DAILY 90 tablet 3   sildenafil (REVATIO) 20 MG tablet TAKE 1-5 TABLETS BY MOUTH DAILY AS INSTRUCTED 90 tablet 11   Current Facility-Administered Medications  Medication Dose Route Frequency Provider Last Rate Last Admin   0.9 %  sodium chloride infusion  500 mL Intravenous Once Danis, Andreas Blower, MD          ___________________________________________________________________ Objective   Exam:  BP (!) 146/89   Pulse 79   Temp 98.4 F (36.9 C) (Temporal)   Resp 10   Ht 6\' 4"  (1.93 m)   Wt 240 lb (108.9 kg)   SpO2 98%   BMI 29.21 kg/m   CV: regular , S1/S2 Resp: clear to auscultation bilaterally, normal RR and effort noted GI: soft, no tenderness, with  active bowel sounds.   Assessment: Encounter Diagnosis  Name Primary?   Special screening for malignant neoplasms, colon Yes     Plan: Colonoscopy   The benefits and risks of the planned procedure(s) were described in detail with the patient or (when appropriate) their health care proxy.  Risks were outlined as including, but not limited to, bleeding, infection, perforation, adverse medication reaction leading to cardiac or pulmonary decompensation, pancreatitis (if ERCP).  The limitation of incomplete mucosal visualization was also discussed.  No guarantees or warranties were given.  The patient is appropriate for an endoscopic procedure in the ambulatory setting.   - Amada Jupiter, MD

## 2023-12-17 ENCOUNTER — Telehealth: Payer: Self-pay

## 2023-12-17 NOTE — Telephone Encounter (Signed)
  Follow up Call-     12/16/2023    7:29 AM  Call back number  Post procedure Call Back phone  # [phone]  Permission to leave phone message Yes     Patient questions:  Do you have a fever, pain , or abdominal swelling? No. Pain Score  0 *  Have you tolerated food without any problems? Yes.    Have you been able to return to your normal activities? Yes.    Do you have any questions about your discharge instructions: Diet   No. Medications  No. Follow up visit  No.  Do you have questions or concerns about your Care? No.  Actions: * If pain score is 4 or above: No action needed, pain <4.

## 2023-12-18 LAB — SURGICAL PATHOLOGY

## 2023-12-28 ENCOUNTER — Encounter: Payer: Self-pay | Admitting: Gastroenterology

## 2024-01-14 DIAGNOSIS — G4733 Obstructive sleep apnea (adult) (pediatric): Secondary | ICD-10-CM | POA: Diagnosis not present

## 2024-02-08 DIAGNOSIS — D224 Melanocytic nevi of scalp and neck: Secondary | ICD-10-CM | POA: Diagnosis not present

## 2024-02-08 DIAGNOSIS — B078 Other viral warts: Secondary | ICD-10-CM | POA: Diagnosis not present

## 2024-02-08 DIAGNOSIS — L820 Inflamed seborrheic keratosis: Secondary | ICD-10-CM | POA: Diagnosis not present

## 2024-02-13 DIAGNOSIS — G4733 Obstructive sleep apnea (adult) (pediatric): Secondary | ICD-10-CM | POA: Diagnosis not present

## 2024-02-18 DIAGNOSIS — E291 Testicular hypofunction: Secondary | ICD-10-CM | POA: Diagnosis not present

## 2024-02-18 DIAGNOSIS — Z125 Encounter for screening for malignant neoplasm of prostate: Secondary | ICD-10-CM | POA: Diagnosis not present

## 2024-03-15 DIAGNOSIS — G4733 Obstructive sleep apnea (adult) (pediatric): Secondary | ICD-10-CM | POA: Diagnosis not present

## 2024-04-12 DIAGNOSIS — Z125 Encounter for screening for malignant neoplasm of prostate: Secondary | ICD-10-CM | POA: Diagnosis not present

## 2024-04-12 DIAGNOSIS — E291 Testicular hypofunction: Secondary | ICD-10-CM | POA: Diagnosis not present

## 2024-04-14 DIAGNOSIS — G4733 Obstructive sleep apnea (adult) (pediatric): Secondary | ICD-10-CM | POA: Diagnosis not present

## 2024-04-18 DIAGNOSIS — D2361 Other benign neoplasm of skin of right upper limb, including shoulder: Secondary | ICD-10-CM | POA: Diagnosis not present

## 2024-04-18 DIAGNOSIS — Z85828 Personal history of other malignant neoplasm of skin: Secondary | ICD-10-CM | POA: Diagnosis not present

## 2024-04-18 DIAGNOSIS — Z85820 Personal history of malignant melanoma of skin: Secondary | ICD-10-CM | POA: Diagnosis not present

## 2024-04-18 DIAGNOSIS — L821 Other seborrheic keratosis: Secondary | ICD-10-CM | POA: Diagnosis not present

## 2024-04-18 DIAGNOSIS — L820 Inflamed seborrheic keratosis: Secondary | ICD-10-CM | POA: Diagnosis not present

## 2024-04-18 DIAGNOSIS — L570 Actinic keratosis: Secondary | ICD-10-CM | POA: Diagnosis not present

## 2024-05-12 ENCOUNTER — Telehealth: Payer: Self-pay | Admitting: *Deleted

## 2024-05-12 NOTE — Telephone Encounter (Signed)
 Received Dermatopathology report from [facility].  We have not seen this patient since 2021 and he does not have any upcoming appointments to see any of our providers.

## 2024-05-15 DIAGNOSIS — G4733 Obstructive sleep apnea (adult) (pediatric): Secondary | ICD-10-CM | POA: Diagnosis not present

## 2024-05-24 ENCOUNTER — Other Ambulatory Visit: Payer: Self-pay | Admitting: Family Medicine

## 2024-05-25 ENCOUNTER — Ambulatory Visit: Payer: Self-pay | Admitting: *Deleted

## 2024-05-25 ENCOUNTER — Encounter: Payer: Self-pay | Admitting: Family Medicine

## 2024-05-25 ENCOUNTER — Ambulatory Visit: Admitting: Family Medicine

## 2024-05-25 VITALS — BP 125/86 | HR 63 | Temp 97.5°F | Resp 18 | Ht 76.0 in | Wt 247.2 lb

## 2024-05-25 DIAGNOSIS — L237 Allergic contact dermatitis due to plants, except food: Secondary | ICD-10-CM | POA: Diagnosis not present

## 2024-05-25 MED ORDER — METHYLPREDNISOLONE ACETATE 80 MG/ML IJ SUSP
80.0000 mg | Freq: Once | INTRAMUSCULAR | Status: AC
Start: 2024-05-25 — End: 2024-05-25
  Administered 2024-05-25 (×2): 80 mg via INTRAMUSCULAR

## 2024-05-25 MED ORDER — PREDNISONE 20 MG PO TABS: ORAL_TABLET | ORAL | 0 refills | Status: AC

## 2024-05-25 MED ORDER — BETAMETHASONE DIPROPIONATE AUG 0.05 % EX OINT: TOPICAL_OINTMENT | Freq: Two times a day (BID) | CUTANEOUS | 0 refills | Status: AC

## 2024-05-25 NOTE — Progress Notes (Signed)
 Subjective:     Patient ID: Thomas Koch, male    DOB: 07/28/1971, 53 y.o.   MRN: [id number]  Chief Complaint  Patient presents with   Poison Ivy    Started 1 week ago on arms and legs, getting worse Using Calamine lotion    HPI Discussed the use of AI scribe software for clinical note transcription with the patient, who gave verbal consent to proceed.  History of Present Illness Thomas Koch is a 53 year old male who presents with a poison ivy rash.  He developed a rash due to poison ivy exposure a week ago while weed eating. The rash is located on both legs and the left arm, with some involvement of the right arm. He has a history of similar exposure years ago, which required a corticosteroid injection for treatment. This time, he has been using calamine lotion for a week without significant improvement.  The rash appears worse than before, with new areas developing. It is described as dry with some puffiness. He notes that the calamine lotion seems more watery than he remembers from previous use. He is concerned about managing the rash as he is scheduled to travel to [place] for work next week and wants to avoid seeking medical care there.     Health Maintenance Due  Topic Date Due   Hepatitis B Vaccines (1 of 3 - 19+ 3-dose series) Never done   INFLUENZA VACCINE  05/13/2024    Past Medical History:  Diagnosis Date   Lumbar back pain    Physical exam, annual    Sleep apnea     Past Surgical History:  Procedure Laterality Date   melanona removed  08/2012   on his upper back     Current Outpatient Medications:    Aspirin-Acetaminophen-Caffeine (GOODY HEADACHE PO), Take 1 packet by mouth every 8 (eight) hours as needed., Disp: , Rfl:    augmented betamethasone  dipropionate (DIPROLENE ) 0.05 % ointment, Apply topically 2 (two) times daily., Disp: 30 g, Rfl: 0   ibuprofen (ADVIL) 200 MG tablet, Take 200 mg by mouth every 6 (six) hours as needed., Disp: , Rfl:     pantoprazole  (PROTONIX ) 40 MG tablet, TAKE 1 TABLET BY MOUTH DAILY, Disp: 90 tablet, Rfl: 3   predniSONE  (DELTASONE ) 20 MG tablet, Take 3 tablets (60 mg total) by mouth daily with breakfast for 3 days, THEN 2 tablets (40 mg total) daily with breakfast for 3 days, THEN 1 tablet (20 mg total) daily with breakfast for 3 days., Disp: 18 tablet, Rfl: 0   sildenafil  (REVATIO ) 20 MG tablet, TAKE 1 TO 5 TABLETS BY MOUTH AS INSTRUCTED, Disp: 90 tablet, Rfl: 11   XYOSTED 100 MG/0.5ML SOAJ, , Disp: , Rfl:   Current Facility-Administered Medications:    methylPREDNISolone  acetate (DEPO-MEDROL ) injection 80 mg, 80 mg, Intramuscular, Once,   Allergies  Allergen Reactions   Penicillamine Rash   Penicillins Rash   ROS neg/noncontributory except as noted HPI/below      Objective:     BP 125/86   Pulse 63   Temp (!) 97.5 F (36.4 C) (Temporal)   Resp 18   Ht 6' 4 (1.93 m)   Wt 247 lb 4 oz (112.2 kg)   SpO2 97%   BMI 30.10 kg/m  Wt Readings from Last 3 Encounters:  05/25/24 247 lb 4 oz (112.2 kg)  12/16/23 240 lb (108.9 kg)  12/07/23 240 lb (108.9 kg)    Physical Exam   Gen: WDWN NAD HEENT:  NCAT, conjunctiva not injected, sclera nonicteric EXT:  no edema MSK: no gross abnormalities.  NEURO: A&O x3.  CN II-XII intact.  PSYCH: normal mood. Good eye contact  Linear vessicles, irrit skin  different stages of healing lower legs B and L forearm     Assessment & Plan:  Poison ivy -     methylPREDNISolone  Acetate  Other orders -     predniSONE ; Take 3 tablets (60 mg total) by mouth daily with breakfast for 3 days, THEN 2 tablets (40 mg total) daily with breakfast for 3 days, THEN 1 tablet (20 mg total) daily with breakfast for 3 days.  Dispense: 18 tablet; Refill: 0 -     Betamethasone  Dipropionate Aug; Apply topically 2 (two) times daily.  Dispense: 30 g; Refill: 0  Assessment and Plan Assessment & Plan Allergic contact dermatitis due to poison ivy exposure   Allergic contact  dermatitis affects both lower legs and the left arm, presenting with rash and blisters. New lesions are appearing, and previous calamine lotion treatment was ineffective. He has a history of similar reactions requiring corticosteroids. The dermatitis results from plant oils, not contagion or scratching. Administer a corticosteroid injection. Prescribe oral prednisone : 3 pills once daily for 3 days, then 2 pills once daily for 3 days, then 1 pill once daily for 3 days, advising to take with food to minimize gastrointestinal upset. Prescribe a topical steroid cream for symptomatic relief, advising against use on the face. Educate on washing off plant oils immediately after exposure to prevent recurrence and advise against mashing blisters to avoid scarring. Discuss potential side effects of prednisone , including gastrointestinal upset, mood changes, and sleep disturbances. Send prescriptions to [facility] in [place].    Return if symptoms worsen or fail to improve.  Jenkins CHRISTELLA Carrel, MD

## 2024-05-25 NOTE — Patient Instructions (Signed)
 [place] soaks

## 2024-05-25 NOTE — Telephone Encounter (Signed)
 FYI Only or Action Required?: FYI only for provider.  Patient was last seen in primary care on 08/21/2023 by Kennyth Worth HERO, MD.  Called Nurse Triage reporting [facility].  Symptoms began a week ago.  Interventions attempted: OTC medications: calamine lotion.  Symptoms are: gradually improving.  Triage Disposition: See Physician Within 24 Hours  Patient/caregiver understands and will follow disposition?: Yes   Reason for Disposition  MODERATE to SEVERE itching (e.g., interferes with work, school, sleep, or other activities)  Answer Assessment - Initial Assessment Questions 1. APPEARANCE of RASH: What does the rash look like?      Spreading- sock area-irritated  2. LOCATION: Where is the rash located?  (e.g., face, genitals, hands, legs)     Legs and arms 3. SIZE: How large is the rash?      Localized area 4. ONSET: When did the rash begin?      1 week 5. ITCHING: Does the rash itch? If Yes, ask: How bad is it?     yes 6. EXPOSURE:  How were you exposed to the plant (poison ivy, poison oak, sumac)  When were you exposed?  Note: Sometimes a poison ivy/oak/sumac rash does not appear for 2 to 3 weeks after exposure.      Weed eating 7. PAST HISTORY: Have you had a poison ivy rash before? If Yes, ask: How bad was it?     Years ago  Protocols used: Poison Ivy - Oak - [facility]   Copied from [employer] [id number]. Topic: Clinical - Red Word Triage >> May 25, 2024  8:21 AM Mesmerise C wrote: [facility] that prompted transfer to Nurse Triage: Patient has poison ivy for a week, used no lotion no improvement and inquiring if can get a shot

## 2024-05-25 NOTE — Telephone Encounter (Signed)
 Noted

## 2024-06-15 DIAGNOSIS — G4733 Obstructive sleep apnea (adult) (pediatric): Secondary | ICD-10-CM | POA: Diagnosis not present

## 2024-08-23 ENCOUNTER — Encounter: Payer: BC Managed Care – PPO | Admitting: Family Medicine

## 2024-08-24 ENCOUNTER — Encounter: Payer: Self-pay | Admitting: Family Medicine

## 2024-08-24 ENCOUNTER — Ambulatory Visit: Admitting: Family Medicine

## 2024-08-24 VITALS — BP 102/78 | HR 78 | Temp 97.4°F | Ht 76.0 in | Wt 248.0 lb

## 2024-08-24 DIAGNOSIS — N529 Male erectile dysfunction, unspecified: Secondary | ICD-10-CM

## 2024-08-24 DIAGNOSIS — R7989 Other specified abnormal findings of blood chemistry: Secondary | ICD-10-CM

## 2024-08-24 DIAGNOSIS — Z125 Encounter for screening for malignant neoplasm of prostate: Secondary | ICD-10-CM

## 2024-08-24 DIAGNOSIS — R739 Hyperglycemia, unspecified: Secondary | ICD-10-CM | POA: Diagnosis not present

## 2024-08-24 DIAGNOSIS — Z13220 Encounter for screening for lipoid disorders: Secondary | ICD-10-CM

## 2024-08-24 DIAGNOSIS — Z0001 Encounter for general adult medical examination with abnormal findings: Secondary | ICD-10-CM | POA: Diagnosis not present

## 2024-08-24 DIAGNOSIS — K200 Eosinophilic esophagitis: Secondary | ICD-10-CM | POA: Diagnosis not present

## 2024-08-24 LAB — COMPREHENSIVE METABOLIC PANEL WITH GFR
ALT: 27 U/L (ref 0–53)
AST: 25 U/L (ref 0–37)
Albumin: 4.3 g/dL (ref 3.5–5.2)
Alkaline Phosphatase: 70 U/L (ref 39–117)
BUN: 12 mg/dL (ref 6–23)
CO2: 25 meq/L (ref 19–32)
Calcium: 8.8 mg/dL (ref 8.4–10.5)
Chloride: 105 meq/L (ref 96–112)
Creatinine, Ser: 1.15 mg/dL (ref 0.40–1.50)
GFR: 72.18 mL/min (ref >60.00)
Glucose, Bld: 84 mg/dL (ref 70–99)
Potassium: 4.1 meq/L (ref 3.5–5.1)
Sodium: 140 meq/L (ref 135–145)
Total Bilirubin: 1.8 mg/dL — ABNORMAL HIGH (ref 0.2–1.2)
Total Protein: 6.7 g/dL (ref 6.0–8.3)

## 2024-08-24 LAB — CBC
HCT: 46.8 % (ref 39.0–52.0)
Hemoglobin: 16.2 g/dL (ref 13.0–17.0)
MCHC: 34.6 g/dL (ref 30.0–36.0)
MCV: 91.3 fl (ref 78.0–100.0)
Platelets: 221 K/uL (ref 150.0–400.0)
RBC: 5.13 Mil/uL (ref 4.22–5.81)
RDW: 13.5 % (ref 11.5–15.5)
WBC: 5.7 K/uL (ref 4.0–10.5)

## 2024-08-24 LAB — LIPID PANEL
Cholesterol: 168 mg/dL (ref 0–200)
HDL: 55.9 mg/dL (ref >39.00)
LDL Cholesterol: 100 mg/dL — ABNORMAL HIGH (ref 0–99)
NonHDL: 112.4
Total CHOL/HDL Ratio: 3
Triglycerides: 61 mg/dL (ref 0.0–149.0)
VLDL: 12.2 mg/dL (ref 0.0–40.0)

## 2024-08-24 LAB — TESTOSTERONE: Testosterone: 708.42 ng/dL (ref 300.00–890.00)

## 2024-08-24 LAB — TSH: TSH: 1.08 u[IU]/mL (ref 0.35–5.50)

## 2024-08-24 LAB — PSA: PSA: 0.39 ng/mL (ref 0.10–4.00)

## 2024-08-24 LAB — HEMOGLOBIN A1C: Hgb A1c MFr Bld: 5.7 % (ref 4.6–6.5)

## 2024-08-24 MED ORDER — PANTOPRAZOLE SODIUM 40 MG PO TBEC: 40.0000 mg | DELAYED_RELEASE_TABLET | Freq: Every day | ORAL | 3 refills | Status: AC

## 2024-08-24 MED ORDER — MELOXICAM 15 MG PO TABS: 15.0000 mg | ORAL_TABLET | Freq: Every day | ORAL | 0 refills | Status: DC

## 2024-08-24 MED ORDER — SILDENAFIL CITRATE 20 MG PO TABS: ORAL_TABLET | ORAL | 11 refills | Status: AC

## 2024-08-24 NOTE — Assessment & Plan Note (Signed)
 1 Protonix  40 mg daily.  Tolerating well.

## 2024-08-24 NOTE — Patient Instructions (Signed)
 It was very nice to see you today!  VISIT SUMMARY: You came in for your annual physical exam. We discussed your chronic right hip pain, which may be related to sciatica or trochanteric bursitis. We also reviewed your recent allergic reaction to poison ivy and your plans to improve your diet. Blood work was ordered, and you declined the flu shot.  YOUR PLAN: CHRONIC RIGHT HIP PAIN: You have been experiencing chronic right hip pain for about a year, which worsens with physical activity and at night. The pain sometimes radiates down your leg, suggesting possible sciatica or trochanteric bursitis. -Prescribed meloxicam for anti-inflammatory treatment. -Recommended applying ice to your hip. -Provided a handout with stretches and exercises for trochanteric bursitis. -Discussed the potential for a cortisone injection if symptoms persist, noting risks such as infection and steroid flare. -Offered a referral to physical therapy if needed.  GENERAL HEALTH MAINTENANCE: Routine health check-up and preventive care. -Ordered blood work including cholesterol, A1c, and PSA. -Continue your current diet and exercise regimen. -Next colonoscopy is due in three years. -You declined the flu shot.  Return in about 1 year (around 08/24/2025) for Annual Physical.   Take care, Dr Kennyth  PLEASE NOTE:  If you had any lab tests, please let us  know if you have not heard back within a few days. You may see your results on mychart before we have a chance to review them but we will give you a call once they are reviewed by us .   If we ordered any referrals today, please let us  know if you have not heard from their office within the next week.   If you had any urgent prescriptions sent in today, please check with the pharmacy within an hour of our visit to make sure the prescription was transmitted appropriately.   Please try these tips to maintain a healthy lifestyle:  Eat at least 3 REAL meals and 1-2 snacks per  day.  Aim for no more than 5 hours between eating.  If you eat breakfast, please do so within one hour of getting up.   Each meal should contain half fruits/vegetables, one quarter protein, and one quarter carbs (no bigger than a computer mouse)  Cut down on sweet beverages. This includes juice, soda, and sweet tea.   Drink at least 1 glass of water with each meal and aim for at least 8 glasses per day  Exercise at least 150 minutes every week.     Preventive Care 23-62 Years Old, Male Preventive care refers to lifestyle choices and visits with your health care provider that can promote health and wellness. Preventive care visits are also called wellness exams. What can I expect for my preventive care visit? Counseling During your preventive care visit, your health care provider may ask about your: Medical history, including: Past medical problems. Family medical history. Current health, including: Emotional well-being. Home life and relationship well-being. Sexual activity. Lifestyle, including: Alcohol, nicotine or tobacco, and drug use. Access to firearms. Diet, exercise, and sleep habits. Safety issues such as seatbelt and bike helmet use. Sunscreen use. Work and work astronomer. Physical exam Your health care provider will check your: Height and weight. These may be used to calculate your BMI (body mass index). BMI is a measurement that tells if you are at a healthy weight. Waist circumference. This measures the distance around your waistline. This measurement also tells if you are at a healthy weight and may help predict your risk of certain diseases, such as  type 2 diabetes and high blood pressure. Heart rate and blood pressure. Body temperature. Skin for abnormal spots. What immunizations do I need?  Vaccines are usually given at various ages, according to a schedule. Your health care provider will recommend vaccines for you based on your age, medical history, and  lifestyle or other factors, such as travel or where you work. What tests do I need? Screening Your health care provider may recommend screening tests for certain conditions. This may include: Lipid and cholesterol levels. Diabetes screening. This is done by checking your blood sugar (glucose) after you have not eaten for a while (fasting). Hepatitis B test. Hepatitis C test. HIV (human immunodeficiency virus) test. STI (sexually transmitted infection) testing, if you are at risk. Lung cancer screening. Prostate cancer screening. Colorectal cancer screening. Talk with your health care provider about your test results, treatment options, and if necessary, the need for more tests. Follow these instructions at home: Eating and drinking  Eat a diet that includes fresh fruits and vegetables, whole grains, lean protein, and low-fat dairy products. Take vitamin and mineral supplements as recommended by your health care provider. Do not drink alcohol if your health care provider tells you not to drink. If you drink alcohol: Limit how much you have to 0-2 drinks a day. Know how much alcohol is in your drink. In the [place] one drink equals one 12 oz bottle of beer (355 mL), one 5 oz glass of wine (148 mL), or one 1 oz glass of hard liquor (44 mL). Lifestyle Brush your teeth every morning and night with fluoride toothpaste. Floss one time each day. Exercise for at least 30 minutes 5 or more days each week. Do not use any products that contain nicotine or tobacco. These products include cigarettes, chewing tobacco, and vaping devices, such as e-cigarettes. If you need help quitting, ask your health care provider. Do not use drugs. If you are sexually active, practice safe sex. Use a condom or other form of protection to prevent STIs. Take aspirin only as told by your health care provider. Make sure that you understand how much to take and what form to take. Work with your health care provider to find  out whether it is safe and beneficial for you to take aspirin daily. Find healthy ways to manage stress, such as: Meditation, yoga, or listening to music. Journaling. Talking to a trusted person. Spending time with friends and family. Minimize exposure to UV radiation to reduce your risk of skin cancer. Safety Always wear your seat belt while driving or riding in a vehicle. Do not drive: If you have been drinking alcohol. Do not ride with someone who has been drinking. When you are tired or distracted. While texting. If you have been using any mind-altering substances or drugs. Wear a helmet and other protective equipment during sports activities. If you have firearms in your house, make sure you follow all gun safety procedures. What's next? Go to your health care provider once a year for an annual wellness visit. Ask your health care provider how often you should have your eyes and teeth checked. Stay up to date on all vaccines. This information is not intended to replace advice given to you by your health care provider. Make sure you discuss any questions you have with your health care provider. Document Revised: 03/27/2021 Document Reviewed: 03/27/2021 Elsevier Patient Education  2024 [employer].

## 2024-08-24 NOTE — Assessment & Plan Note (Signed)
 On testosterone  replacement per urology.  Check labs today.

## 2024-08-24 NOTE — Progress Notes (Signed)
 Chief Complaint:  Thomas Koch is a 53 y.o. male who presents today for his annual comprehensive physical exam.    Assessment/Plan:  New/Acute Problems: Right Leg Pain Consistent with trochanteric bursitis though may have small amount of sciatica as well.  We discussed treatment options.  Will start conservative management with meloxicam 15 mg daily for the next few weeks.  Also discussed home exercise program and handout was given.  He can place ice to the area as well.  We did discuss referral to physical therapy however declined for now.  He may if it from steroid injection though hold off on this for now.  He will let us  know if he would like to pursue this at some point in the next several weeks.  He has orthopedic - does not need referral.   Chronic Problems Addressed Today: Low testosterone  On testosterone  replacement per urology.  Check labs today.  Hyperglycemia Check A1c with labs.  Eosinophilic esophagitis 1 Protonix  40 mg daily.  Tolerating well.  Erectile dysfunction Stable on sildenafil  as needed.  Will refill today.   Preventative Healthcare: Check labs.  Vaccines declined.  Due for colonoscopy in 2028.  Patient Counseling(The following topics were reviewed and/or handout was given):  -Nutrition: Stressed importance of moderation in sodium/caffeine intake, saturated fat and cholesterol, caloric balance, sufficient intake of fresh fruits, vegetables, and fiber.  -Stressed the importance of regular exercise.   -Substance Abuse: Discussed cessation/primary prevention of tobacco, alcohol, or other drug use; driving or other dangerous activities under the influence; availability of treatment for abuse.   -Injury prevention: Discussed safety belts, safety helmets, smoke detector, smoking near bedding or upholstery.   -Sexuality: Discussed sexually transmitted diseases, partner selection, use of condoms, avoidance of unintended pregnancy and contraceptive alternatives.    -Dental health: Discussed importance of regular tooth brushing, flossing, and dental visits.  -Health maintenance and immunizations reviewed. Please refer to Health maintenance section.  Return to care in 1 year for next preventative visit.     Subjective:  HPI:  He has no acute complaints today. Patient is here today for his annual physical.  See assessment / plan for status of chronic conditions.  Discussed the use of AI scribe software for clinical note transcription with the patient, who gave verbal consent to proceed.  History of Present Illness Thomas Koch is a 53 year old male who presents for an annual physical exam.  He has been experiencing chronic right hip pain for about a year, described as a pinching sensation that may be related to a nerve. The pain is exacerbated by lying on his right side and worsens at night, especially after physical activities like working out or playing tennis. It sometimes radiates down his leg. He has tried ibuprofen and Tylenol for relief, and previously used a steroid-like medication for swelling, which provided some nighttime relief. He recalls taking meloxicam in the past, which helped but he ran out of it. Despite the pain, he remains active, engaging in gym workouts and tennis, but notes that the hip pain affects his sleep, making it difficult to rest at night.  A couple of months ago, he had a significant allergic reaction to poison ivy exposure while weed eating. He was exposed for about thirty seconds but experienced widespread symptoms due to the oil splattering.  He is planning to improve his diet post-holidays, having already ordered healthier food options like rice, lentils, and turkey for the upcoming week. He travels regularly to [place] for work,  specifically to [facility], and finds the travel exhausting.       08/24/2024    8:27 AM  Depression screen PHQ 2/9  Decreased Interest 0  Down, Depressed, Hopeless 0  PHQ - 2 Score 0     There are no preventive care reminders to display for this patient.   ROS: Per HPI, otherwise a complete review of systems was negative.   PMH:  The following were reviewed and entered/updated in epic: Past Medical History:  Diagnosis Date   Lumbar back pain    Physical exam, annual    Sleep apnea    Patient Active Problem List   Diagnosis Date Noted   Cervical radiculopathy 08/21/2023   Low testosterone  08/21/2023   Hyperglycemia 08/21/2022   Cubital tunnel syndrome on left 10/09/2021   Erectile dysfunction 08/16/2019   Chronic back pain 08/16/2019   History of melanoma 08/16/2019   Eosinophilic esophagitis 01/11/2018   Seasonal allergic rhinitis 01/11/2018   Allergic conjunctivitis 01/11/2018   Food allergy 01/11/2018   Sleep apnea, unspecified 08/13/2017   Past Surgical History:  Procedure Laterality Date   melanona removed  08/2012   on his upper back    Family History  Problem Relation Age of Onset   Osteoporosis Mother    Colon polyps Mother    Hyperlipidemia Mother    COPD Father    Hyperlipidemia Father    Hypertension Father    Heart attack Maternal Grandfather 51   Diabetes Paternal Grandmother    Heart disease Paternal Grandmother    Heart disease Paternal Grandfather    Colon cancer Neg Hx    Rectal cancer Neg Hx    Stomach cancer Neg Hx    Esophageal cancer Neg Hx     Medications- reviewed and updated Current Outpatient Medications  Medication Sig Dispense Refill   Aspirin-Acetaminophen-Caffeine (GOODY HEADACHE PO) Take 1 packet by mouth every 8 (eight) hours as needed.     augmented betamethasone  dipropionate (DIPROLENE ) 0.05 % ointment Apply topically 2 (two) times daily. 30 g 0   ibuprofen (ADVIL) 200 MG tablet Take 200 mg by mouth every 6 (six) hours as needed.     meloxicam (MOBIC) 15 MG tablet Take 1 tablet (15 mg total) by mouth daily. 30 tablet 0   XYOSTED 100 MG/0.5ML SOAJ      pantoprazole  (PROTONIX ) 40 MG tablet Take 1 tablet  (40 mg total) by mouth daily. 90 tablet 3   sildenafil  (REVATIO ) 20 MG tablet TAKE 1 TO 5 TABLETS BY MOUTH AS INSTRUCTED 90 tablet 11   No current facility-administered medications for this visit.    Allergies-reviewed and updated Allergies  Allergen Reactions   Penicillamine Rash   Penicillins Rash    Social History   Socioeconomic History   Marital status: Married    Spouse name: Faizan Geraci   Number of children: 1   Years of education: Not on file   Highest education level: Not on file  Occupational History   Occupation: office worker  Tobacco Use   Smoking status: Never   Smokeless tobacco: Never  Vaping Use   Vaping status: Never Used  Substance and Sexual Activity   Alcohol use: Yes    Comment: very rare   Drug use: No   Sexual activity: Yes    Partners: Female  Other Topics Concern   Not on file  Social History Narrative   Not on file   Social Drivers of Health   Financial Resource Strain: Not on file  Food Insecurity: Not on file  Transportation Needs: Not on file  Physical Activity: Not on file  Stress: Not on file  Social Connections: Not on file        Objective:  Physical Exam: BP 102/78   Pulse 78   Temp (!) 97.4 F (36.3 C) (Temporal)   Ht 6' 4 (1.93 m)   Wt 248 lb (112.5 kg)   SpO2 98%   BMI 30.19 kg/m   Body mass index is 30.19 kg/m. Wt Readings from Last 3 Encounters:  08/24/24 248 lb (112.5 kg)  05/25/24 247 lb 4 oz (112.2 kg)  12/16/23 240 lb (108.9 kg)   Gen: NAD, resting comfortably HEENT: TMs normal bilaterally. OP clear. No thyromegaly noted.  CV: RRR with no murmurs appreciated Pulm: NWOB, CTAB with no crackles, wheezes, or rhonchi GI: Normal bowel sounds present. Soft, Nontender, Nondistended. MSK: no edema, cyanosis, or clubbing noted - Right Leg: No deformities.  Tenderness palpation along posterior aspect of right greater trochanter.  Mild pain with internal/external rotation.  Neurovascular intact  distally. Skin: warm, dry Neuro: CN2-12 grossly intact. Strength 5/5 in upper and lower extremities. Reflexes symmetric and intact bilaterally.  Psych: Normal affect and thought content     Karysa Heft M. Kennyth, MD 08/24/2024 9:00 AM

## 2024-08-24 NOTE — Assessment & Plan Note (Signed)
Stable on sildenafil as needed.  Will refill today. 

## 2024-08-24 NOTE — Assessment & Plan Note (Signed)
Check A1c with labs. 

## 2024-08-29 ENCOUNTER — Ambulatory Visit: Payer: Self-pay | Admitting: Family Medicine

## 2024-08-29 DIAGNOSIS — R17 Unspecified jaundice: Secondary | ICD-10-CM

## 2024-08-29 NOTE — Progress Notes (Signed)
 His bilirubin level is elevated to 1.8.  Recommend he come back to recheck fractionated bilirubin level.  His LDL and A1c was very mildly elevated.  Do not need to start meds for this but he should continue to work on diet and exercise and we can recheck these again in a year.  Everything else is at goal and we can recheck in a year.

## 2024-09-02 ENCOUNTER — Other Ambulatory Visit (INDEPENDENT_AMBULATORY_CARE_PROVIDER_SITE_OTHER)

## 2024-09-02 DIAGNOSIS — R17 Unspecified jaundice: Secondary | ICD-10-CM | POA: Diagnosis not present

## 2024-09-03 LAB — BILIRUBIN, FRACTIONATED(TOT/DIR/INDIR)
Bilirubin, Direct: 0.3 mg/dL — ABNORMAL HIGH (ref 0.0–0.2)
Indirect Bilirubin: 1.3 mg/dL — ABNORMAL HIGH (ref 0.2–1.2)
Total Bilirubin: 1.6 mg/dL — ABNORMAL HIGH (ref 0.2–1.2)

## 2024-09-05 ENCOUNTER — Ambulatory Visit: Payer: Self-pay | Admitting: Family Medicine

## 2024-09-05 NOTE — Progress Notes (Signed)
 His bilirubin level is trending back to normal though not quite at his baseline.  I would like for him to come back in 1 month to recheck.  Please place future order for fractionated bilirubin.

## 2024-09-12 NOTE — Progress Notes (Signed)
 Patient read result via MyChart.   Last read by Jerel Essex at 10:06AM on 09/05/2024.

## 2024-10-17 ENCOUNTER — Other Ambulatory Visit: Payer: Self-pay | Admitting: Family Medicine

## 2024-10-17 NOTE — Telephone Encounter (Signed)
 Copied from CRM [id number]. Topic: Clinical - Medication Refill >> Oct 17, 2024  4:23 PM China J wrote: Medication: meloxicam  (MOBIC ) 15 MG tablet  Has the patient contacted their pharmacy? No (Agent: If no, request that the patient contact the pharmacy for the refill. If patient does not wish to contact the pharmacy document the reason why and proceed with request.) (Agent: If yes, when and what did the pharmacy advise?) Patient was discussing a separate matter with me and remembered a refill for this medication was needed.  This is the patient's preferred pharmacy:  [id number] [place], [place] - [address] [place] [address] Phone: [phone] Fax: [phone]  Is this the correct pharmacy for this prescription? Yes If no, delete pharmacy and type the correct one.   Has the prescription been filled recently? No  Is the patient out of the medication? Yes  Has the patient been seen for an appointment in the last year OR does the patient have an upcoming appointment? Yes  Can we respond through MyChart? Yes  Agent: Please be advised that Rx refills may take up to 3 business days. We ask that you follow-up with your pharmacy.

## 2024-10-18 ENCOUNTER — Other Ambulatory Visit: Payer: Self-pay | Admitting: *Deleted

## 2024-10-18 ENCOUNTER — Telehealth: Payer: Self-pay | Admitting: Family Medicine

## 2024-10-18 DIAGNOSIS — R17 Unspecified jaundice: Secondary | ICD-10-CM

## 2024-10-18 MED ORDER — MELOXICAM 15 MG PO TABS: 15.0000 mg | ORAL_TABLET | Freq: Every day | ORAL | 0 refills | Status: AC

## 2024-10-18 NOTE — Telephone Encounter (Signed)
 LVM for Patient to schedule Lab appointment

## 2024-10-18 NOTE — Telephone Encounter (Signed)
 Copied from CRM #[id number]. Topic: Clinical - Request for Lab/Test Order >> Oct 17, 2024  4:20 PM China J wrote: Reason for CRM: The patient is needing a lab order for fractionated bilirubin sent in as Dr. Kennyth wanted him to follow-up a month after his labs from November.  Please call for scheduling.   Lab order, Please schedule a lab appt  [facility]

## 2024-10-28 ENCOUNTER — Other Ambulatory Visit (INDEPENDENT_AMBULATORY_CARE_PROVIDER_SITE_OTHER)

## 2024-10-28 DIAGNOSIS — R17 Unspecified jaundice: Secondary | ICD-10-CM

## 2024-10-29 LAB — BILIRUBIN, FRACTIONATED(TOT/DIR/INDIR)
Bilirubin, Direct: 0.3 mg/dL — ABNORMAL HIGH (ref 0.0–0.2)
Indirect Bilirubin: 1.5 mg/dL — ABNORMAL HIGH (ref 0.2–1.2)
Total Bilirubin: 1.8 mg/dL — ABNORMAL HIGH (ref 0.2–1.2)

## 2024-11-01 ENCOUNTER — Ambulatory Visit: Payer: Self-pay | Admitting: Family Medicine

## 2024-11-01 NOTE — Progress Notes (Signed)
 His bilirubin is mildly elevated.  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  This should not cause him any issues going forward.  We can recheck labs again when he comes back in for his annual physical next year.

## 2025-08-25 ENCOUNTER — Encounter: Admitting: Family Medicine
